# Patient Record
Sex: Female | Born: 1985 | Race: Black or African American | Hispanic: No | Marital: Single | Smoking: Never smoker
Health system: Southern US, Community
[De-identification: ages and names within clinical notes are randomized; demographics above are authoritative.]

## PROBLEM LIST (undated history)

## (undated) DIAGNOSIS — T7840XA Allergy, unspecified, initial encounter: Secondary | ICD-10-CM

## (undated) DIAGNOSIS — B019 Varicella without complication: Secondary | ICD-10-CM

## (undated) DIAGNOSIS — J45909 Unspecified asthma, uncomplicated: Secondary | ICD-10-CM

## (undated) HISTORY — DX: Unspecified asthma, uncomplicated: J45.909

## (undated) HISTORY — DX: Allergy, unspecified, initial encounter: T78.40XA

## (undated) HISTORY — DX: Varicella without complication: B01.9

---

## 2006-02-16 ENCOUNTER — Ambulatory Visit: Payer: Self-pay | Admitting: Obstetrics & Gynecology

## 2006-02-18 ENCOUNTER — Ambulatory Visit (HOSPITAL_COMMUNITY): Admission: RE | Admit: 2006-02-18 | Discharge: 2006-02-18 | Payer: Self-pay | Admitting: Obstetrics & Gynecology

## 2006-02-21 ENCOUNTER — Ambulatory Visit: Payer: Self-pay | Admitting: Family Medicine

## 2006-03-02 ENCOUNTER — Ambulatory Visit: Payer: Self-pay | Admitting: Obstetrics & Gynecology

## 2006-03-09 ENCOUNTER — Ambulatory Visit: Payer: Self-pay | Admitting: Obstetrics & Gynecology

## 2006-03-16 ENCOUNTER — Ambulatory Visit: Payer: Self-pay | Admitting: Family Medicine

## 2006-03-23 ENCOUNTER — Ambulatory Visit: Payer: Self-pay | Admitting: Gynecology

## 2006-03-30 ENCOUNTER — Inpatient Hospital Stay (HOSPITAL_COMMUNITY): Admission: AD | Admit: 2006-03-30 | Discharge: 2006-04-02 | Payer: Self-pay | Admitting: Obstetrics and Gynecology

## 2006-03-30 ENCOUNTER — Ambulatory Visit: Payer: Self-pay | Admitting: Family Medicine

## 2006-03-30 ENCOUNTER — Ambulatory Visit: Payer: Self-pay | Admitting: Obstetrics & Gynecology

## 2007-09-11 IMAGING — US US OB COMP +14 WK
1 series · 13 of 28 positions shown · non-contrast
Comparison: none

CLINICAL DATA: No prenatal care.  Anatomy.

[Series 1: us ob comp +14 wk · 0.33mm/px · 13 of 53 slices shown]
[im 2/53]
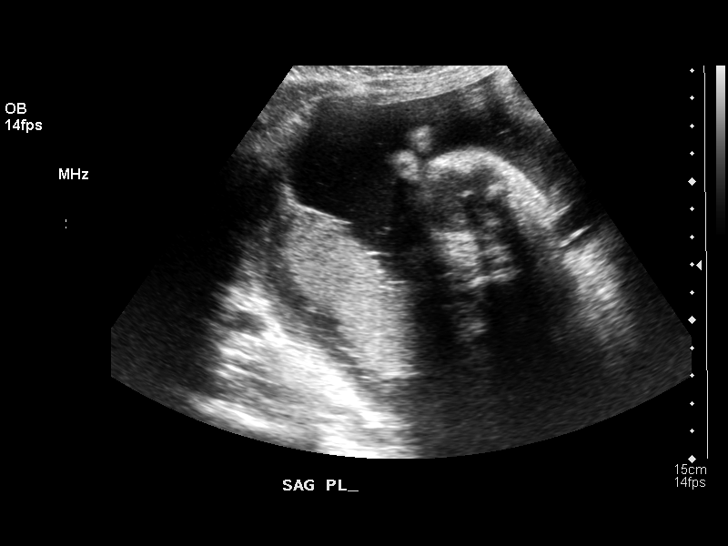
[im 6/53]
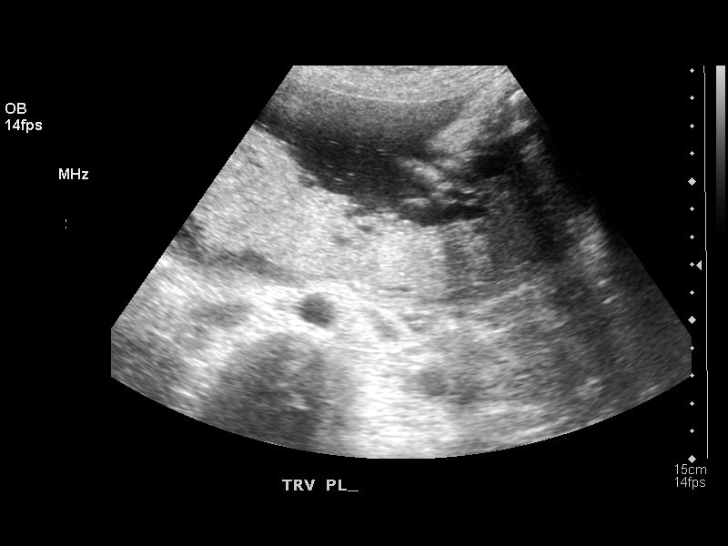
[im 10/53]
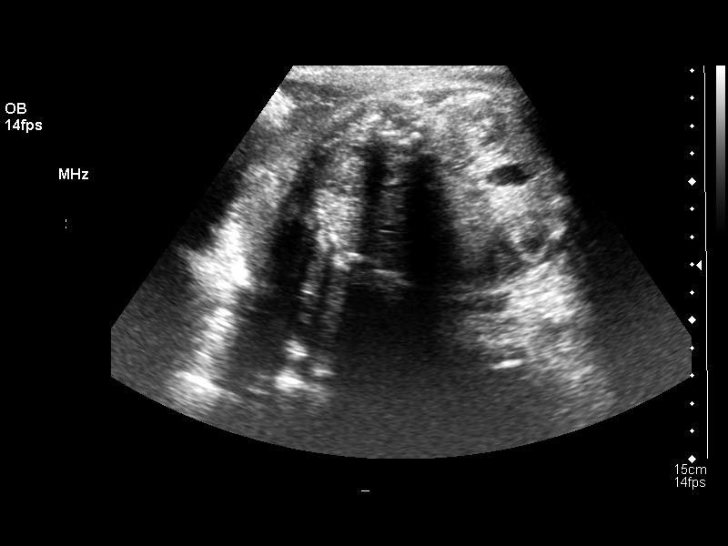
[im 14/53]
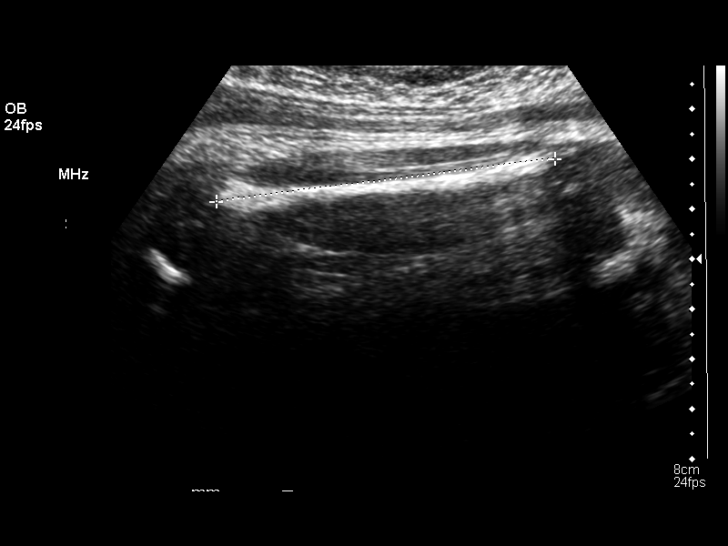
[im 18/53]
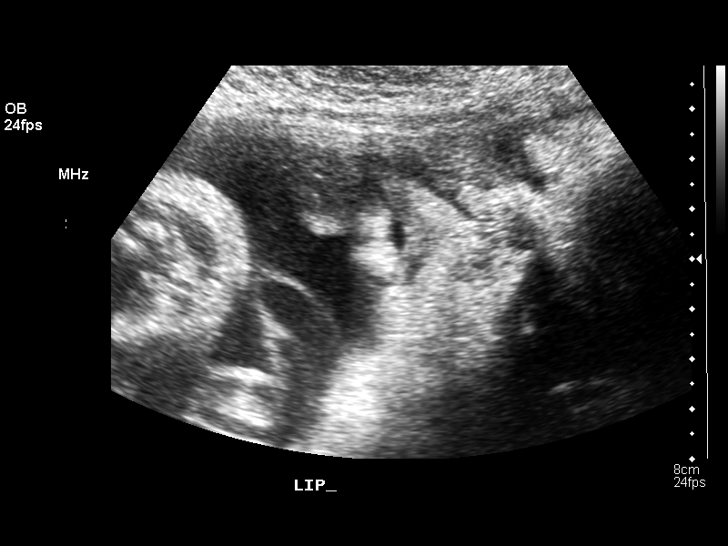
[im 22/53]
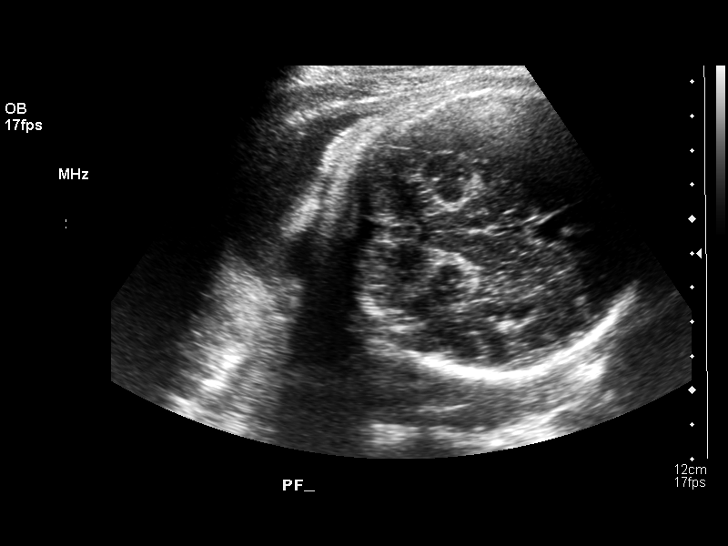
[im 27/53]
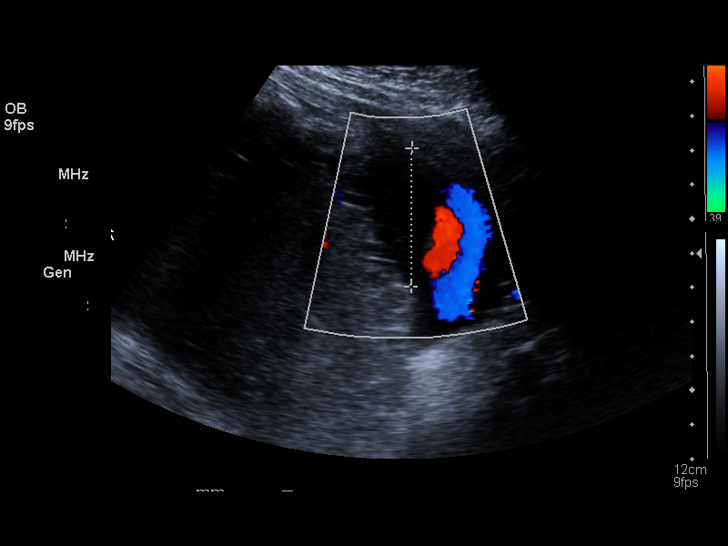
[im 31/53]
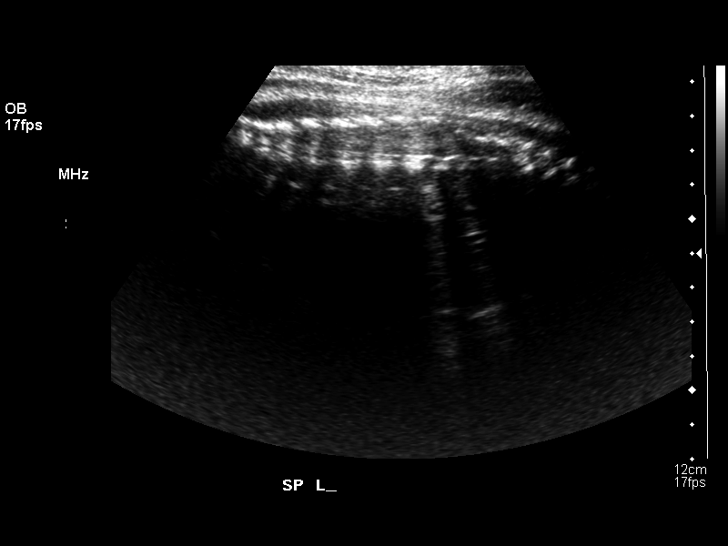
[im 35/53]
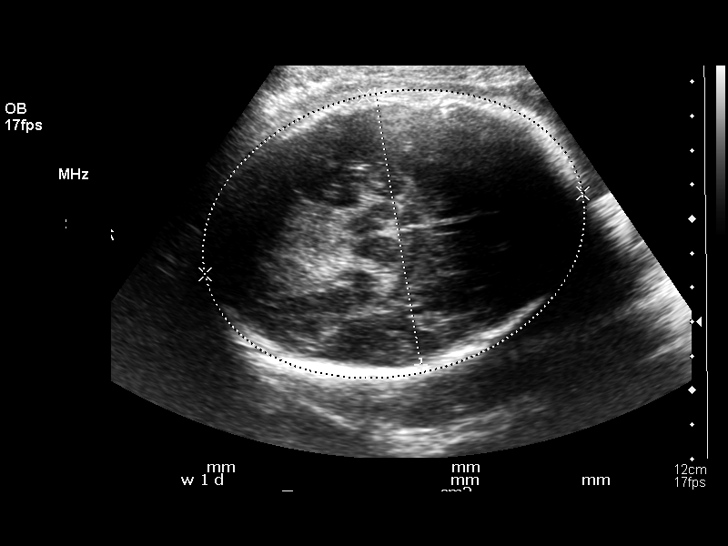
[im 39/53]
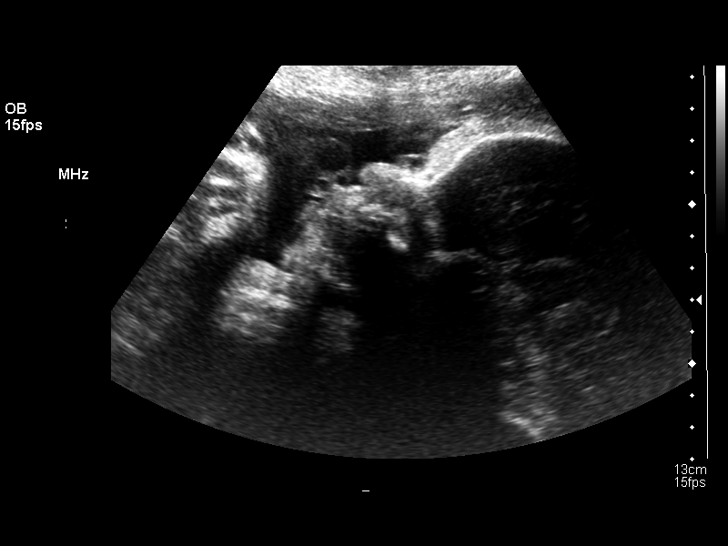
[im 43/53]
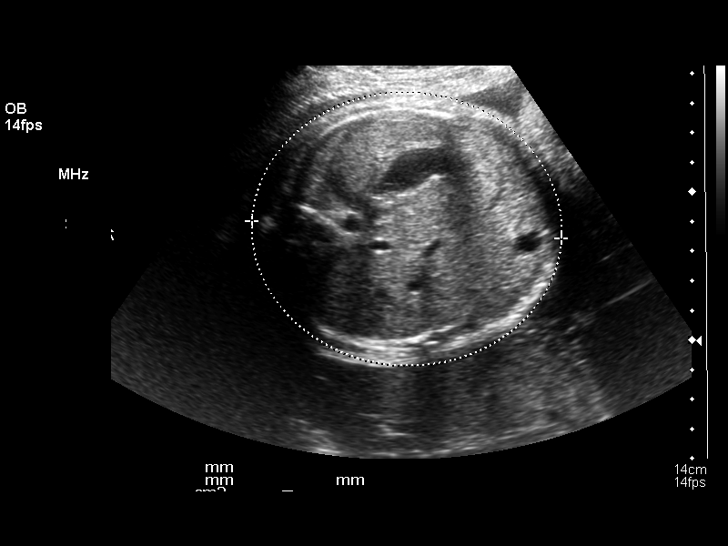
[im 47/53]
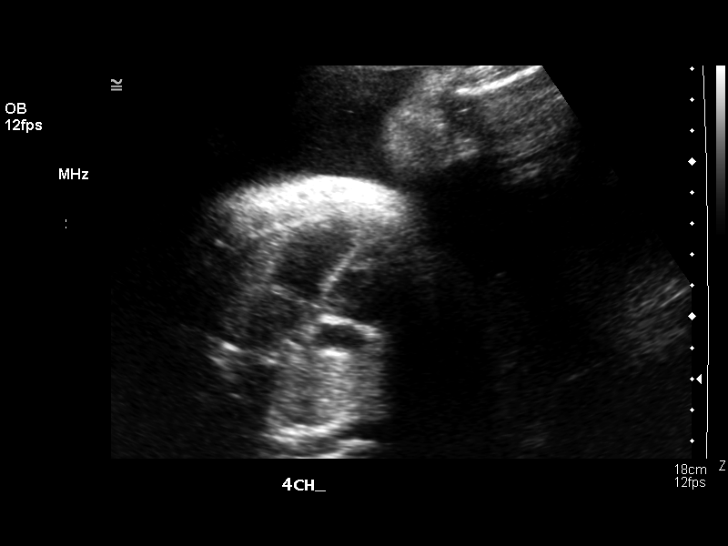
[im 51/53]
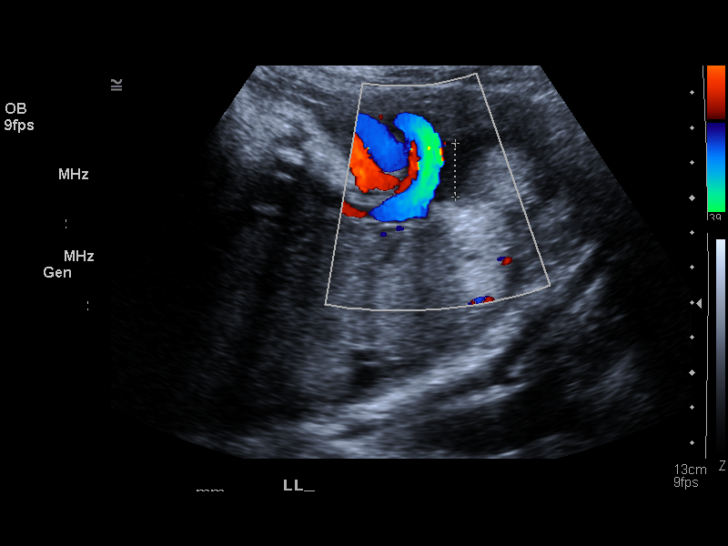

[13 of 28 positions shown; findings below may reference images not displayed]

OBSTETRICAL ULTRASOUND:
 Number of Fetuses: 1
 Heart Rate:  131
 Movement:  Yes
 Breathing:  No  
 Presentation:  Cephalic
 Placental Location:  Posterior
 Grade:  II
 Previa:  No
 Amniotic Fluid (Subjective):  Normal
 Amniotic Fluid (Objective):   14.6 cm AFI (5th -95th%ile = 7.9 ? 24.9 cm for 35 wks)

 FETAL BIOMETRY
 BPD:   8.2 cm   33 w 1 d
 HC:   30.9 cm   34 w 3 d
 AC:   31.3 cm   35 w 2 d
 FL:    6.9 cm   35 w 2 d

 MEAN GA:  34 w 4 d  US EDC:  03/28/06

 EFW:  1555 g (H) 50th ? 75th%ile (3888 ? 2453 g) For 35 wks

 FETAL ANATOMY
 Lateral Ventricles:    Visualized 
 Thalami/CSP:      Visualized 
 Posterior Fossa:  Not visualized 
 Nuchal Region:    N/A
 Spine:      Not visualized 
 4 Chamber Heart on Left:      Visualized 
 Stomach on Left:      Visualized 
 3 Vessel Cord:    Visualized 
 Cord Insertion site:    Not visualized 
 Kidneys:  Visualized 
 Bladder:  Visualized 
 Extremities:      Not visualized 

 ADDITIONAL ANATOMY VISUALIZED:  Upper lip, diaphragm, and female genitalia.

 Evaluation limited by:  Advanced gestational age 

 MATERNAL UTERINE AND ADNEXAL FINDINGS
 Cervix: Not evaluated > 34 wks
IMPRESSION: 1.  Single living intrauterine fetus in cephalic presentation with subjectively and quantitatively normal amniotic fluid volume.  The estimated mean gestational age by ultrasound today is 34 weeks 4 days which is within one week of the reported gestational age by LMP.  
 2.  Visualized fetal anatomy is unremarkable although the exam is substantially limited by the advanced gestational age.

## 2013-08-18 ENCOUNTER — Emergency Department (HOSPITAL_COMMUNITY)
Admission: EM | Admit: 2013-08-18 | Discharge: 2013-08-18 | Disposition: A | Discharge status: Home / Self Care | Payer: No Typology Code available for payment source | Attending: Emergency Medicine | Admitting: Emergency Medicine

## 2013-08-18 ENCOUNTER — Encounter (HOSPITAL_COMMUNITY): Payer: Self-pay

## 2013-08-18 DIAGNOSIS — Z79899 Other long term (current) drug therapy: Secondary | ICD-10-CM | POA: Insufficient documentation

## 2013-08-18 DIAGNOSIS — S0990XA Unspecified injury of head, initial encounter: Secondary | ICD-10-CM | POA: Insufficient documentation

## 2013-08-18 DIAGNOSIS — Y92410 Unspecified street and highway as the place of occurrence of the external cause: Secondary | ICD-10-CM | POA: Insufficient documentation

## 2013-08-18 DIAGNOSIS — S0993XA Unspecified injury of face, initial encounter: Secondary | ICD-10-CM | POA: Insufficient documentation

## 2013-08-18 DIAGNOSIS — Y9389 Activity, other specified: Secondary | ICD-10-CM | POA: Insufficient documentation

## 2013-08-18 MED ORDER — HYDROCODONE-ACETAMINOPHEN 5-325 MG PO TABS
1.0000 | ORAL_TABLET | Freq: Once | ORAL | Status: AC
  Administered 2013-08-18: 1 via ORAL
  Filled 2013-08-18: qty 1

## 2013-08-18 MED ORDER — CYCLOBENZAPRINE HCL 10 MG PO TABS: 10.0000 mg | ORAL_TABLET | Freq: Two times a day (BID) | ORAL | Status: DC | PRN

## 2013-08-18 MED ORDER — IBUPROFEN 800 MG PO TABS: 800.0000 mg | ORAL_TABLET | Freq: Three times a day (TID) | ORAL | Status: DC
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
## 2013-08-19 NOTE — ED Provider Notes (Signed)
Medical screening examination/treatment/procedure(s) were performed by non-physician practitioner and as supervising physician I was immediately available for consultation/collaboration.   Dagmar Hait, MD 08/19/13 [id number]

## 2013-08-21 ENCOUNTER — Encounter (HOSPITAL_COMMUNITY): Payer: Self-pay | Admitting: Emergency Medicine

## 2013-08-21 ENCOUNTER — Other Ambulatory Visit: Payer: Self-pay

## 2013-08-21 ENCOUNTER — Emergency Department (HOSPITAL_COMMUNITY)
Admission: EM | Admit: 2013-08-21 | Discharge: 2013-08-21 | Disposition: A | Discharge status: Home / Self Care | Payer: No Typology Code available for payment source | Attending: Emergency Medicine | Admitting: Emergency Medicine

## 2013-08-21 DIAGNOSIS — M542 Cervicalgia: Secondary | ICD-10-CM | POA: Insufficient documentation

## 2013-08-21 DIAGNOSIS — G8911 Acute pain due to trauma: Secondary | ICD-10-CM | POA: Insufficient documentation

## 2013-08-21 DIAGNOSIS — R209 Unspecified disturbances of skin sensation: Secondary | ICD-10-CM | POA: Insufficient documentation

## 2013-08-21 DIAGNOSIS — Z79899 Other long term (current) drug therapy: Secondary | ICD-10-CM | POA: Insufficient documentation

## 2013-08-21 DIAGNOSIS — R51 Headache: Secondary | ICD-10-CM | POA: Insufficient documentation

## 2013-08-21 DIAGNOSIS — R200 Anesthesia of skin: Secondary | ICD-10-CM
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
## 2013-08-21 NOTE — Progress Notes (Signed)
[facility] did not get to see patient but will be sending her information about the [facility], using the address provided.

## 2013-08-22 NOTE — ED Provider Notes (Signed)
Medical screening examination/treatment/procedure(s) were performed by non-physician practitioner and as supervising physician I was immediately available for consultation/collaboration.     Aundraya Dripps R Reginald Mangels, MD 08/22/13 2356 

## 2016-01-02 ENCOUNTER — Ambulatory Visit: Payer: Self-pay | Admitting: Certified Nurse Midwife

## 2016-01-14 ENCOUNTER — Ambulatory Visit: Payer: Self-pay | Admitting: Certified Nurse Midwife

## 2016-01-16 ENCOUNTER — Encounter: Payer: Self-pay | Admitting: Certified Nurse Midwife

## 2016-01-16 ENCOUNTER — Ambulatory Visit (INDEPENDENT_AMBULATORY_CARE_PROVIDER_SITE_OTHER): Payer: BLUE CROSS/BLUE SHIELD | Admitting: Certified Nurse Midwife

## 2016-01-16 VITALS — BP 127/73 | HR 72 | Temp 98.0°F | Wt 178.0 lb

## 2016-01-16 DIAGNOSIS — Z01419 Encounter for gynecological examination (general) (routine) without abnormal findings: Secondary | ICD-10-CM | POA: Diagnosis not present

## 2016-01-16 DIAGNOSIS — E669 Obesity, unspecified: Secondary | ICD-10-CM | POA: Insufficient documentation

## 2016-01-16 DIAGNOSIS — Z113 Encounter for screening for infections with a predominantly sexual mode of transmission: Secondary | ICD-10-CM

## 2016-01-16 DIAGNOSIS — Z809 Family history of malignant neoplasm, unspecified: Secondary | ICD-10-CM

## 2016-01-16 DIAGNOSIS — Z30018 Encounter for initial prescription of other contraceptives: Secondary | ICD-10-CM

## 2016-01-16 LAB — CBC WITH DIFFERENTIAL/PLATELET
Basophils Absolute: 0 10*3/uL (ref 0.0–0.1)
Basophils Relative: 0 % (ref 0–1)
Eosinophils Absolute: 0 10*3/uL (ref 0.0–0.7)
Eosinophils Relative: 1 % (ref 0–5)
HCT: 35.4 % — ABNORMAL LOW (ref 36.0–46.0)
Hemoglobin: 11.6 g/dL — ABNORMAL LOW (ref 12.0–15.0)
Lymphocytes Relative: 42 % (ref 12–46)
Lymphs Abs: 1.9 10*3/uL (ref 0.7–4.0)
MCH: 26.9 pg (ref 26.0–34.0)
MCHC: 32.8 g/dL (ref 30.0–36.0)
MCV: 81.9 fL (ref 78.0–100.0)
MPV: 9.7 fL (ref 8.6–12.4)
Monocytes Absolute: 0.5 10*3/uL (ref 0.1–1.0)
Monocytes Relative: 10 % (ref 3–12)
Neutro Abs: 2.2 10*3/uL (ref 1.7–7.7)
Neutrophils Relative %: 47 % (ref 43–77)
Platelets: 350 10*3/uL (ref 150–400)
RBC: 4.32 MIL/uL (ref 3.87–5.11)
RDW: 13.5 % (ref 11.5–15.5)
WBC: 4.6 10*3/uL (ref 4.0–10.5)

## 2016-01-16 LAB — COMPREHENSIVE METABOLIC PANEL
ALT: 8 U/L (ref 6–29)
AST: 11 U/L (ref 10–30)
Albumin: 3.8 g/dL (ref 3.6–5.1)
Alkaline Phosphatase: 62 U/L (ref 33–115)
BUN: 8 mg/dL (ref 7–25)
CO2: 22 mmol/L (ref 20–31)
Calcium: 9 mg/dL (ref 8.6–10.2)
Chloride: 108 mmol/L (ref 98–110)
Creat: 0.74 mg/dL (ref 0.50–1.10)
Glucose, Bld: 71 mg/dL (ref 65–99)
Potassium: 4.5 mmol/L (ref 3.5–5.3)
Sodium: 140 mmol/L (ref 135–146)
Total Bilirubin: 0.8 mg/dL (ref 0.2–1.2)
Total Protein: 6.5 g/dL (ref 6.1–8.1)

## 2016-01-16 LAB — HDL CHOLESTEROL: HDL: 52 mg/dL (ref >=46)

## 2016-01-16 LAB — TRIGLYCERIDES: Triglycerides: 64 mg/dL (ref <150)

## 2016-01-16 LAB — HEPATITIS C ANTIBODY: HCV Ab: NEGATIVE

## 2016-01-16 LAB — HIV ANTIBODY (ROUTINE TESTING W REFLEX): HIV 1&2 Ab, 4th Generation: NONREACTIVE

## 2016-01-16 LAB — HEPATITIS B SURFACE ANTIGEN: Hepatitis B Surface Ag: NEGATIVE

## 2016-01-16 LAB — CHOLESTEROL, TOTAL: Cholesterol: 133 mg/dL (ref 125–200)

## 2016-01-16 MED ORDER — ETONOGESTREL-ETHINYL ESTRADIOL 0.12-0.015 MG/24HR VA RING: VAGINAL_RING | VAGINAL | Status: DC

## 2016-01-16 NOTE — Addendum Note (Signed)
Addended by: Samantha Crimes on: 01/16/2016 01:17 PM   Modules accepted: Orders

## 2016-01-16 NOTE — Progress Notes (Signed)
Patient ID: Amy Miles, female   DOB: 1986-11-03, 30 y.o.   MRN: [id number]    Subjective:      Amy Miles is a 30 y.o. female here for a routine exam.  Current complaints: weight loss is stagnant, reports regular exercise.  Is sexually active, using condoms occasionally.  Has used OCPs, Mirena, Nuva Ring and condoms in the past.  Had excessive weight gain of over 20 lbs with Mirena.  Just stopped period yesterday.  Monthly menses, lasting 5 days, with dysmenorrhea, denies any clots.  Reports spotting with BM the week before her period, denies any rectal bleeding or hx of hemorrhoids.  Desires full STD screening.    Personal health questionnaire:  Is patient [name], have a family history of breast and/or ovarian cancer: yes, Maternal aunt Ovarian CA, MGM BCA: in her 16's.   Is there a family history of uterine cancer diagnosed at age < 30, gastrointestinal cancer, urinary tract cancer, family member who is a Amy Miles syndrome-associated carrier: yes, father deceased from pancreatic cancer.   Is the patient overweight and hypertensive, family history of diabetes, personal history of gestational diabetes, preeclampsia or PCOS: yes Is patient over 74, have PCOS,  family history of premature CHD under age 41, diabetes, smoke, have hypertension or peripheral artery disease:  yes At any time, has a partner hit, kicked or otherwise hurt or frightened you?: no Over the past 2 weeks, have you felt down, depressed or hopeless?: no Over the past 2 weeks, have you felt little interest or pleasure in doing things?:no   Gynecologic History Patient's last menstrual period was 01/11/2016. Contraception: condoms Last Pap: unknown. Results were: normal according to the patient Last mammogram: N/A.   Obstetric History OB History  No data available    History reviewed. No pertinent past medical history.  History reviewed. No pertinent past surgical history.   Current outpatient  prescriptions:  .  cyclobenzaprine (FLEXERIL) 10 MG tablet, Take 1 tablet (10 mg total) by mouth 2 (two) times daily as needed for muscle spasms. (Patient not taking: Reported on 01/16/2016), Disp: 20 tablet, Rfl: 0 .  etonogestrel-ethinyl estradiol (NUVARING) 0.12-0.015 MG/24HR vaginal ring, Insert vaginally and leave in place for 3 consecutive weeks, then remove for 1 week., Disp: 3 each, Rfl: 4 .  norgestimate-ethinyl estradiol (ORTHO-CYCLEN,SPRINTEC,PREVIFEM) 0.25-35 MG-MCG tablet, Take 1 tablet by mouth daily. Reported on 01/16/2016, Disp: , Rfl:  No Known Allergies  Social History  Substance Use Topics  . Smoking status: Never Smoker   . Smokeless tobacco: Not on file  . Alcohol Use: No    Family History  Problem Relation Age of Onset  . Cancer Father       Review of Systems  Constitutional: negative for fatigue and weight loss Respiratory: negative for cough and wheezing Cardiovascular: negative for chest pain, fatigue and palpitations Gastrointestinal: negative for abdominal pain and change in bowel habits Musculoskeletal:negative for myalgias Neurological: negative for gait problems and tremors Behavioral/Psych: negative for abusive relationship, depression Endocrine: negative for temperature intolerance   Genitourinary:negative for abnormal menstrual periods, genital lesions, hot flashes, sexual problems and vaginal discharge Integument/breast: negative for breast lump, breast tenderness, nipple discharge and skin lesion(s)    Objective:       BP 127/73 mmHg  Pulse 72  Temp(Src) 98 F (36.7 C)  Wt 178 lb (80.74 kg)  LMP 01/11/2016 General:   alert  Skin:   no rash or abnormalities  Lungs:   clear to auscultation bilaterally  Heart:   regular rate and rhythm, S1, S2 normal, no murmur, click, rub or gallop  Breasts:   normal without suspicious masses, skin or nipple changes or axillary nodes  Abdomen:  normal findings: no organomegaly, soft, non-tender and no  hernia obese  Pelvis:  External genitalia: normal general appearance Urinary system: urethral meatus normal and bladder without fullness, nontender Vaginal: normal without tenderness, induration or masses Cervix: normal appearance Adnexa: normal bimanual exam Uterus: anteverted and non-tender, normal size   Lab Review Urine pregnancy test Labs reviewed yes Radiologic studies reviewed no  50% of 30 min visit spent on counseling and coordination of care.   Assessment:    Healthy female exam.   Contraception management: initial rx for Nuva Ring  STD screening exam  Obesity  Family hx of cancers Plan:    Education reviewed: calcium supplements, depression evaluation, low fat, low cholesterol diet, safe sex/STD prevention, self breast exams, skin cancer screening and weight bearing exercise. Contraception: NuvaRing vaginal inserts. Follow up in: 1 year.   Prenatal vitamin samples given  Meds ordered this encounter  Medications  . etonogestrel-ethinyl estradiol (NUVARING) 0.12-0.015 MG/24HR vaginal ring    Sig: Insert vaginally and leave in place for 3 consecutive weeks, then remove for 1 week.    Dispense:  3 each    Refill:  4   Orders Placed This Encounter  Procedures  . SureSwab, Vaginosis/Vaginitis Plus  . HIV antibody (with reflex)  . Hepatitis B surface antigen  . RPR  . Hepatitis C antibody  . CBC with Differential/Platelet  . Comprehensive metabolic panel  . TSH  . Cholesterol, total  . Triglycerides  . HDL cholesterol  . Ambulatory referral to Genetics    Referral Priority:  Routine    Referral Type:  Consultation    Referral Reason:  Specialty Services Required    Number of Visits Requested:  1  . Ambulatory referral to Internal Medicine    Referral Priority:  Routine    Referral Type:  Consultation    Referral Reason:  Specialty Services Required    Requested Specialty:  Internal Medicine    Number of Visits Requested:  1   Need to obtain previous  records Possible management options include: OCPs Follow up as needed.

## 2016-01-17 LAB — TSH: TSH: 1.04 mIU/L

## 2016-01-17 LAB — RPR

## 2016-01-19 ENCOUNTER — Telehealth: Payer: Self-pay | Admitting: Genetic Counselor

## 2016-01-19 NOTE — Telephone Encounter (Signed)
Left message on pt's voicemail in regards to a reference from [facility] to schedule appt with Gen. Coun. Left callback number

## 2016-01-21 LAB — PAP, TP IMAGING W/ HPV RNA, RFLX HPV TYPE 16,18/45: HPV mRNA, High Risk: NOT DETECTED

## 2016-01-22 LAB — SURESWAB, VAGINOSIS/VAGINITIS PLUS
Atopobium vaginae: NOT DETECTED Log (cells/mL)
C. albicans, DNA: NOT DETECTED
C. glabrata, DNA: NOT DETECTED
C. parapsilosis, DNA: NOT DETECTED
C. trachomatis RNA, TMA: NOT DETECTED
C. tropicalis, DNA: NOT DETECTED
Gardnerella vaginalis: NOT DETECTED Log (cells/mL)
LACTOBACILLUS SPECIES: NOT DETECTED Log (cells/mL)
MEGASPHAERA SPECIES: NOT DETECTED Log (cells/mL)
N. gonorrhoeae RNA, TMA: NOT DETECTED
T. vaginalis RNA, QL TMA: NOT DETECTED

## 2016-02-26 ENCOUNTER — Ambulatory Visit (INDEPENDENT_AMBULATORY_CARE_PROVIDER_SITE_OTHER): Payer: BLUE CROSS/BLUE SHIELD | Admitting: Family

## 2016-02-26 ENCOUNTER — Encounter: Payer: Self-pay | Admitting: Family

## 2016-02-26 VITALS — BP 120/80 | HR 76 | Temp 98.0°F | Resp 18 | Ht 60.0 in | Wt 176.0 lb

## 2016-02-26 DIAGNOSIS — E669 Obesity, unspecified: Secondary | ICD-10-CM

## 2016-02-26 NOTE — Assessment & Plan Note (Signed)
BMI of 34. She continues to work on lifestyle management with a goal of 5-10% of current body weight loss. Recommend increasing physical activity to 30 minutes of moderate level activity daily. Encourage nutritional intake that focuses on nutrient dense foods and is moderate, varied, and balanced and is low in saturated fats and processed/sugary foods. Discussed importance of continuing to challenge her exercise through time, resistance, or modality. Follow-up in one month with food and exercise log to check progress.

## 2016-02-26 NOTE — Progress Notes (Signed)
Subjective:    Patient ID: Amy Miles, female    DOB: [birth year], 30 y.o.   MRN: [id number]  Chief Complaint  Patient presents with  . Establish Care    wants to talk about weight, get some pointers on what to do and the best things to eat    HPI:  Amy Miles is a 30 y.o. female who  has a past medical history of Chicken pox. and presents today for an office visit to establish care.   1.)  Weight Issues / Concerns: Associated symptom of increased weight in the context of obesity has been going on for several years. Modifying factors include physical activity about 3 times of cardiovascular and resistance training as well as nutrition modifcations including fruits and vegetables as well as trying to reduce fast food and processed/sugary foods. In the past she has worked on portion control. She has also started with a Fitbit during the last year. Describes like she is at a sticking point.    Wt Readings from Last 3 Encounters:  02/26/16 176 lb (79.833 kg)  01/16/16 178 lb (80.74 kg)    No Known Allergies   Outpatient Prescriptions Prior to Visit  Medication Sig Dispense Refill  . etonogestrel-ethinyl estradiol (NUVARING) 0.12-0.015 MG/24HR vaginal ring Insert vaginally and leave in place for 3 consecutive weeks, then remove for 1 week. 3 each 4  . cyclobenzaprine (FLEXERIL) 10 MG tablet Take 1 tablet (10 mg total) by mouth 2 (two) times daily as needed for muscle spasms. (Patient not taking: Reported on 01/16/2016) 20 tablet 0  . norgestimate-ethinyl estradiol (ORTHO-CYCLEN,SPRINTEC,PREVIFEM) 0.25-35 MG-MCG tablet Take 1 tablet by mouth daily. Reported on 01/16/2016     No facility-administered medications prior to visit.     Past Medical History  Diagnosis Date  . Chicken pox     History reviewed. No pertinent past surgical history.   Family History  Problem Relation Age of Onset  . Pancreatic cancer Father   . Healthy Mother   . Breast cancer Maternal  Grandmother   . Diabetes Maternal Grandmother   . Stroke Maternal Grandmother   . Heart disease Paternal Grandmother   . Lung cancer Paternal Grandfather   . Throat cancer Paternal Grandfather      Social History   Social History  . Marital Status: Single    Spouse Name: N/A  . Number of Children: 1  . Years of Education: 14   Occupational History  . Daycare Teacher    Social History Main Topics  . Smoking status: Never Smoker   . Smokeless tobacco: Never Used  . Alcohol Use: 0.0 oz/week    0 Standard drinks or equivalent per week     Comment: Social / Office manager  . Drug Use: No  . Sexual Activity:    Partners: Male    Copy: None, Inserts   Other Topics Concern  . Not on file   Social History Narrative   Fun: Play outside with her daughter, watch TV   Denies abuse and feels safe at home.     Review of Systems  Constitutional: Negative for fever and chills.  Respiratory: Negative for chest tightness and shortness of breath.   Cardiovascular: Negative for chest pain, palpitations and leg swelling.  Neurological: Negative for dizziness and light-headedness.      Objective:    BP 120/80 mmHg  Pulse 76  Temp(Src) 98 F (36.7 C) (Oral)  Resp 18  Ht 5' (1.524 m)  Wt 176 lb (79.833 kg)  BMI 34.37 kg/m2  SpO2 99% Nursing note and vital signs reviewed.  Physical Exam  Constitutional: She is oriented to person, place, and time. She appears well-developed and well-nourished. No distress.  Cardiovascular: Normal rate, regular rhythm, normal heart sounds and intact distal pulses.   Pulmonary/Chest: Effort normal and breath sounds normal.  Neurological: She is alert and oriented to person, place, and time.  Skin: Skin is warm and dry.  Psychiatric: She has a normal mood and affect. Her behavior is normal. Judgment and thought content normal.       Assessment & Plan:   Problem List Items Addressed This Visit      Other   Obesity -  Primary    BMI of 34. She continues to work on lifestyle management with a goal of 5-10% of current body weight loss. Recommend increasing physical activity to 30 minutes of moderate level activity daily. Encourage nutritional intake that focuses on nutrient dense foods and is moderate, varied, and balanced and is low in saturated fats and processed/sugary foods. Discussed importance of continuing to challenge her exercise through time, resistance, or modality. Follow-up in one month with food and exercise log to check progress.          I have discontinued Ms. Corredor's norgestimate-ethinyl estradiol and cyclobenzaprine. I am also having her maintain her etonogestrel-ethinyl estradiol.   Follow-up: Return in about 1 month (around 03/27/2016).  Amy Miles, Amy Ekstrand, FNP

## 2016-02-26 NOTE — Patient Instructions (Addendum)
Thank you for choosing [facility].  Summary/Instructions:  Please track your calories and exercise.  Increase one of the following in you exercise - TIME, RESISTANCE, OR MODALITY (differing machines)  Aim for 30 minutes of moderate level activity daily.  Nutrient dense foods like fruits and vegetables with minimal processed/sugary foods.  Exercising to Lose Weight Exercising can help you to lose weight. In order to lose weight through exercise, you need to do vigorous-intensity exercise. You can tell that you are exercising with vigorous intensity if you are breathing very hard and fast and cannot hold a conversation while exercising. Moderate-intensity exercise helps to maintain your current weight. You can tell that you are exercising at a moderate level if you have a higher heart rate and faster breathing, but you are still able to hold a conversation. HOW OFTEN SHOULD I EXERCISE? Choose an activity that you enjoy and set realistic goals. Your health care provider can help you to make an activity plan that works for you. Exercise regularly as directed by your health care provider. This may include:  Doing resistance training twice each week, such as:  Push-ups.  Sit-ups.  Lifting weights.  Using resistance bands.  Doing a given intensity of exercise for a given amount of time. Choose from these options:  150 minutes of moderate-intensity exercise every week.  75 minutes of vigorous-intensity exercise every week.  A mix of moderate-intensity and vigorous-intensity exercise every week. Children, pregnant women, people who are out of shape, people who are overweight, and older adults may need to consult a health care provider for individual recommendations. If you have any sort of medical condition, be sure to consult your health care provider before starting a new exercise program. WHAT ARE SOME ACTIVITIES THAT CAN HELP ME TO LOSE WEIGHT?   Walking at a rate of at least  4.5 miles an hour.  Jogging or running at a rate of 5 miles per hour.  Biking at a rate of at least 10 miles per hour.  Lap swimming.  Roller-skating or in-line skating.  Cross-country skiing.  Vigorous competitive sports, such as football, basketball, and soccer.  Jumping rope.  Aerobic dancing. HOW CAN I BE MORE ACTIVE IN MY DAY-TO-DAY ACTIVITIES?  Use the stairs instead of the elevator.  Take a walk during your lunch break.  If you drive, park your car farther away from work or school.  If you take public transportation, get off one stop early and walk the rest of the way.  Make all of your phone calls while standing up and walking around.  Get up, stretch, and walk around every 30 minutes throughout the day. WHAT GUIDELINES SHOULD I FOLLOW WHILE EXERCISING?  Do not exercise so much that you hurt yourself, feel dizzy, or get very short of breath.  Consult your health care provider prior to starting a new exercise program.  Wear comfortable clothes and shoes with good support.  Drink plenty of water while you exercise to prevent dehydration or heat stroke. Body water is lost during exercise and must be replaced.  Work out until you breathe faster and your heart beats faster.   This information is not intended to replace advice given to you by your health care provider. Make sure you discuss any questions you have with your health care provider.   Document Released: 12/11/2010 Document Revised: 11/29/2014 Document Reviewed: 04/11/2014 Elsevier Interactive Patient Education 2016 [employer]  Calorie Counting for [employer] Calories are energy you get from the  things you eat and drink. Your body uses this energy to keep you going throughout the day. The number of calories you eat affects your weight. When you eat more calories than your body needs, your body stores the extra calories as fat. When you eat fewer calories than your body needs, your body burns fat to  get the energy it needs. Calorie counting means keeping track of how many calories you eat and drink each day. If you make sure to eat fewer calories than your body needs, you should lose weight. In order for calorie counting to work, you will need to eat the number of calories that are right for you in a day to lose a healthy amount of weight per week. A healthy amount of weight to lose per week is usually 1-2 lb (0.5-0.9 kg). A dietitian can determine how many calories you need in a day and give you suggestions on how to reach your calorie goal.  WHAT IS MY MY PLAN? My goal is to have __________ calories per day.  If I have this many calories per day, I should lose around __________ pounds per week. WHAT DO I NEED TO KNOW ABOUT CALORIE COUNTING? In order to meet your daily calorie goal, you will need to:  Find out how many calories are in each food you would like to eat. Try to do this before you eat.  Decide how much of the food you can eat.  Write down what you ate and how many calories it had. Doing this is called keeping a food log. WHERE DO I FIND CALORIE INFORMATION? The number of calories in a food can be found on a Nutrition Facts label. Note that all the information on a label is based on a specific serving of the food. If a food does not have a Nutrition Facts label, try to look up the calories online or ask your dietitian for help. HOW DO I DECIDE HOW MUCH TO EAT? To decide how much of the food you can eat, you will need to consider both the number of calories in one serving and the size of one serving. This information can be found on the Nutrition Facts label. If a food does not have a Nutrition Facts label, look up the information online or ask your dietitian for help. Remember that calories are listed per serving. If you choose to have more than one serving of a food, you will have to multiply the calories per serving by the amount of servings you plan to eat. For example, the label  on a package of bread might say that a serving size is 1 slice and that there are 90 calories in a serving. If you eat 1 slice, you will have eaten 90 calories. If you eat 2 slices, you will have eaten 180 calories. HOW DO I KEEP A FOOD LOG? After each meal, record the following information in your food log:  What you ate.  How much of it you ate.  How many calories it had.  Then, add up your calories. Keep your food log near you, such as in a small notebook in your pocket. Another option is to use a mobile app or website. Some programs will calculate calories for you and show you how many calories you have left each time you add an item to the log. WHAT ARE SOME CALORIE COUNTING TIPS?  Use your calories on foods and drinks that will fill you up and not leave you  hungry. Some examples of this include foods like nuts and nut butters, vegetables, lean proteins, and high-fiber foods (more than 5 g fiber per serving).  Eat nutritious foods and avoid empty calories. Empty calories are calories you get from foods or beverages that do not have many nutrients, such as candy and soda. It is better to have a nutritious high-calorie food (such as an avocado) than a food with few nutrients (such as a bag of chips).  Know how many calories are in the foods you eat most often. This way, you do not have to look up how many calories they have each time you eat them.  Look out for foods that may seem like low-calorie foods but are really high-calorie foods, such as baked goods, soda, and fat-free candy.  Pay attention to calories in drinks. Drinks such as sodas, specialty coffee drinks, alcohol, and juices have a lot of calories yet do not fill you up. Choose low-calorie drinks like water and diet drinks.  Focus your calorie counting efforts on higher calorie items. Logging the calories in a garden salad that contains only vegetables is less important than calculating the calories in a milk shake.  Find a  way of tracking calories that works for you. Get creative. Most people who are successful find ways to keep track of how much they eat in a day, even if they do not count every calorie. WHAT ARE SOME PORTION CONTROL TIPS?  Know how many calories are in a serving. This will help you know how many servings of a certain food you can have.  Use a measuring cup to measure serving sizes. This is helpful when you start out. With time, you will be able to estimate serving sizes for some foods.  Take some time to put servings of different foods on your favorite plates, bowls, and cups so you know what a serving looks like.  Try not to eat straight from a bag or box. Doing this can lead to overeating. Put the amount you would like to eat in a cup or on a plate to make sure you are eating the right portion.  Use smaller plates, glasses, and bowls to prevent overeating. This is a quick and easy way to practice portion control. If your plate is smaller, less food can fit on it.  Try not to multitask while eating, such as watching TV or using your computer. If it is time to eat, sit down at a table and enjoy your food. Doing this will help you to start recognizing when you are full. It will also make you more aware of what and how much you are eating. HOW CAN I CALORIE COUNT WHEN EATING OUT?  Ask for smaller portion sizes or child-sized portions.  Consider sharing an entree and sides instead of getting your own entree.  If you get your own entree, eat only half. Ask for a box at the beginning of your meal and put the rest of your entree in it so you are not tempted to eat it.  Look for the calories on the menu. If calories are listed, choose the lower calorie options.  Choose dishes that include vegetables, fruits, whole grains, low-fat dairy products, and lean protein. Focusing on smart food choices from each of the 5 food groups can help you stay on track at restaurants.  Choose items that are boiled,  broiled, grilled, or steamed.  Choose water, milk, unsweetened iced tea, or other drinks without added sugars.  If you want an alcoholic beverage, choose a lower calorie option. For example, a regular margarita can have up to 700 calories and a glass of wine has around 150.  Stay away from items that are buttered, battered, fried, or served with cream sauce. Items labeled "crispy" are usually fried, unless stated otherwise.  Ask for dressings, sauces, and syrups on the side. These are usually very high in calories, so do not eat much of them.  Watch out for salads. Many people think salads are a healthy option, but this is often not the case. Many salads come with bacon, fried chicken, lots of cheese, fried chips, and dressing. All of these items have a lot of calories. If you want a salad, choose a garden salad and ask for grilled meats or steak. Ask for the dressing on the side, or ask for olive oil and vinegar or lemon to use as dressing.  Estimate how many servings of a food you are given. For example, a serving of cooked rice is  cup or about the size of half a tennis ball or one cupcake wrapper. Knowing serving sizes will help you be aware of how much food you are eating at restaurants. The list below tells you how big or small some common portion sizes are based on everyday objects.  1 oz--4 stacked dice.  3 oz--1 deck of cards.  1 tsp--1 dice.  1 Tbsp-- a Ping-Pong ball.  2 Tbsp--1 Ping-Pong ball.   cup--1 tennis ball or 1 cupcake wrapper.  1 cup--1 baseball.   This information is not intended to replace advice given to you by your health care provider. Make sure you discuss any questions you have with your health care provider.   Document Released: 11/08/2005 Document Revised: 11/29/2014 Document Reviewed: 09/13/2013 Elsevier Interactive Patient Education [employer]

## 2016-02-26 NOTE — Progress Notes (Signed)
Pre visit review using our clinic review tool, if applicable. No additional management support is needed unless otherwise documented below in the visit note. 

## 2016-03-31 ENCOUNTER — Ambulatory Visit: Payer: BLUE CROSS/BLUE SHIELD | Admitting: Family

## 2016-04-15 ENCOUNTER — Ambulatory Visit: Payer: BLUE CROSS/BLUE SHIELD | Admitting: Family

## 2017-02-14 ENCOUNTER — Ambulatory Visit (INDEPENDENT_AMBULATORY_CARE_PROVIDER_SITE_OTHER): Payer: BLUE CROSS/BLUE SHIELD | Admitting: Family

## 2017-02-14 ENCOUNTER — Encounter: Payer: Self-pay | Admitting: Family

## 2017-02-14 VITALS — BP 116/76 | HR 65 | Temp 98.3°F | Resp 16 | Ht 60.0 in | Wt 175.8 lb

## 2017-02-14 DIAGNOSIS — Z23 Encounter for immunization: Secondary | ICD-10-CM

## 2017-02-14 DIAGNOSIS — Z0000 Encounter for general adult medical examination without abnormal findings: Secondary | ICD-10-CM | POA: Diagnosis not present

## 2017-02-14 NOTE — Assessment & Plan Note (Signed)
1) Anticipatory Guidance: Discussed importance of wearing a seatbelt while driving and not texting while driving; changing batteries in smoke detector at least once annually; wearing suntan lotion when outside; eating a balanced and moderate diet; getting physical activity at least 30 minutes per day.  2) Immunizations / Screenings / Labs:  Tetanus updated today. All other immunizations are up-to-date per recommendations. Cervical cancer screening up-to-date. Obtain vitamin D for vitamin D deficiency screening. Due for a dental exam encouraged to be completed independently. All other screenings are up-to-date per recommendations. Obtain CBC, CMET, and lipid profile.    Overall well exam with risk factors for cardiovascular disease including obesity. Recommend weight loss of 5-10% of current body weight through nutrition and physical activity changes. Encouraged to consume a moderate, balance, and varied nutritional intake. Continue physical activity increasing with goal to 3-4 days per week. She does have aspiration a becoming a paramedic and taking the physical agility test shortly. Continue other healthy lifestyle behaviors and choices. Follow-up prevention exam in 1 year. Follow-up office visit pending blood work.

## 2017-02-14 NOTE — Progress Notes (Signed)
Subjective:    Patient ID: Amy Miles, female    DOB: 07/13/1986, 31 y.o.   MRN: [id number]  Chief Complaint  Patient presents with  . CPE    have not ate since 8:30    HPI:  Amy Miles is a 31 y.o. female who presents today for an annual wellness visit.   1) Health Maintenance -   Diet - Averages about 3 meals per day with some snacks and consisting of a regular diet; Caffeine intake every now and then   Exercise - 2-3x per week consisting of cardio and some resistance training.    2) Preventative Exams / Immunizations:  Dental -- Due for exam  Vision -- Up to date   Health Maintenance  Topic Date Due  . TETANUS/TDAP  05/27/2003  . INFLUENZA VACCINE  02/19/2017 (Originally 06/22/2016)  . PAP SMEAR  01/15/2019  . HIV Screening  Completed     There is no immunization history on file for this patient.   No Known Allergies   Outpatient Medications Prior to Visit  Medication Sig Dispense Refill  . etonogestrel-ethinyl estradiol (NUVARING) 0.12-0.015 MG/24HR vaginal ring Insert vaginally and leave in place for 3 consecutive weeks, then remove for 1 week. 3 each 4   No facility-administered medications prior to visit.      Past Medical History:  Diagnosis Date  . Chicken pox      History reviewed. No pertinent surgical history.   Family History  Problem Relation Age of Onset  . Pancreatic cancer Father   . Healthy Mother   . Breast cancer Maternal Grandmother   . Diabetes Maternal Grandmother   . Stroke Maternal Grandmother   . Heart disease Paternal Grandmother   . Lung cancer Paternal Grandfather   . Throat cancer Paternal Grandfather      Social History   Social History  . Marital status: Single    Spouse name: N/A  . Number of children: 1  . Years of education: [id number]   Occupational History  . Daycare Teacher    Social History Main Topics  . Smoking status: Never Smoker  . Smokeless tobacco: Never Used  . Alcohol use 0.0  oz/week     Comment: Social / Office managerccassional  . Drug use: No  . Sexual activity: Yes    Partners: Male    Birth control/ protection: None, Inserts   Other Topics Concern  . Not on file   Social History Narrative   Fun: Play outside with her daughter, watch TV   Denies abuse and feels safe at home.       Review of Systems  Constitutional: Denies fever, chills, fatigue, or significant weight gain/loss. HENT: Head: Denies headache or neck pain Ears: Denies changes in hearing, ringing in ears, earache, drainage Nose: Denies discharge, stuffiness, itching, nosebleed, sinus pain Throat: Denies sore throat, hoarseness, dry mouth, sores, thrush Eyes: Denies loss/changes in vision, pain, redness, blurry/double vision, flashing lights Cardiovascular: Denies chest pain/discomfort, tightness, palpitations, shortness of breath with activity, difficulty lying down, swelling, sudden awakening with shortness of breath Respiratory: Denies shortness of breath, cough, sputum production, wheezing Gastrointestinal: Denies dysphasia, heartburn, change in appetite, nausea, change in bowel habits, rectal bleeding, constipation, diarrhea, yellow skin or eyes Genitourinary: Denies frequency, urgency, burning/pain, blood in urine, incontinence, change in urinary strength. Musculoskeletal: Denies muscle/joint pain, stiffness, back pain, redness or swelling of joints, trauma Skin: Denies rashes, lumps, itching, dryness, color changes, or hair/nail changes Neurological: Denies dizziness, fainting, seizures,  weakness, numbness, tingling, tremor Psychiatric - Denies nervousness, stress, depression or memory loss Endocrine: Denies heat or cold intolerance, sweating, frequent urination, excessive thirst, changes in appetite Hematologic: Denies ease of bruising or bleeding     Objective:     BP 116/76 (BP Location: Left Arm, Patient Position: Sitting, Cuff Size: Large)   Pulse 65   Temp 98.3 F (36.8 C) (Oral)    Resp 16   Ht 5' (1.524 m)   Wt 175 lb 12.8 oz (79.7 kg)   SpO2 98%   BMI 34.33 kg/m  Nursing note and vital signs reviewed.  Physical Exam  Constitutional: She is oriented to person, place, and time. She appears well-developed and well-nourished.  HENT:  Head: Normocephalic.  Right Ear: Hearing, tympanic membrane, external ear and ear canal normal.  Left Ear: Hearing, tympanic membrane, external ear and ear canal normal.  Nose: Nose normal.  Mouth/Throat: Uvula is midline, oropharynx is clear and moist and mucous membranes are normal.  Eyes: Conjunctivae and EOM are normal. Pupils are equal, round, and reactive to light.  Neck: Neck supple. No JVD present. No tracheal deviation present. No thyromegaly present.  Cardiovascular: Normal rate, regular rhythm, normal heart sounds and intact distal pulses.   Pulmonary/Chest: Effort normal and breath sounds normal.  Abdominal: Soft. Bowel sounds are normal. She exhibits no distension and no mass. There is no tenderness. There is no rebound and no guarding.  Musculoskeletal: Normal range of motion. She exhibits no edema or tenderness.  Lymphadenopathy:    She has no cervical adenopathy.  Neurological: She is alert and oriented to person, place, and time. She has normal reflexes. No cranial nerve deficit. She exhibits normal muscle tone. Coordination normal.  Skin: Skin is warm and dry.  Psychiatric: She has a normal mood and affect. Her behavior is normal. Judgment and thought content normal.       Assessment & Plan:   Problem List Items Addressed This Visit      Other   Routine adult health maintenance    1) Anticipatory Guidance: Discussed importance of wearing a seatbelt while driving and not texting while driving; changing batteries in smoke detector at least once annually; wearing suntan lotion when outside; eating a balanced and moderate diet; getting physical activity at least 30 minutes per day.  2) Immunizations / Screenings  / Labs:  Tetanus updated today. All other immunizations are up-to-date per recommendations. Cervical cancer screening up-to-date. Obtain vitamin D for vitamin D deficiency screening. Due for a dental exam encouraged to be completed independently. All other screenings are up-to-date per recommendations. Obtain CBC, CMET, and lipid profile.    Overall well exam with risk factors for cardiovascular disease including obesity. Recommend weight loss of 5-10% of current body weight through nutrition and physical activity changes. Encouraged to consume a moderate, balance, and varied nutritional intake. Continue physical activity increasing with goal to 3-4 days per week. She does have aspiration a becoming a paramedic and taking the physical agility test shortly. Continue other healthy lifestyle behaviors and choices. Follow-up prevention exam in 1 year. Follow-up office visit pending blood work.       Relevant Orders   CBC   Comprehensive metabolic panel   Lipid panel   TSH   VITAMIN D 25 Hydroxy (Vit-D Deficiency, Fractures)       I am having Ms. Simoneaux maintain her etonogestrel-ethinyl estradiol.   Follow-up: Return in about 1 year (around 02/14/2018), or if symptoms worsen or fail to improve.  Mauricio Po, FNP

## 2017-02-14 NOTE — Patient Instructions (Addendum)
Thank you for choosing [facility].  SUMMARY AND INSTRUCTIONS:  Labs:  Please stop by the lab on the lower level of the building for your blood work. Your results will be released to MyChart (or called to you) after review, usually within 72 hours after test completion. If any changes need to be made, you will be notified at that same time.  1.) The lab is open from 7:30am to 5:30 pm Monday-Friday 2.) No appointment is necessary 3.) Fasting (if needed) is 6-8 hours after food and drink; black coffee and water are okay   Follow up:  If your symptoms worsen or fail to improve, please contact our office for further instruction, or in case of emergency go directly to the emergency room at the closest medical facility.    Health Maintenance, Female Adopting a healthy lifestyle and getting preventive care can go a long way to promote health and wellness. Talk with your health care provider about what schedule of regular examinations is right for you. This is a good chance for you to check in with your provider about disease prevention and staying healthy. In between checkups, there are plenty of things you can do on your own. Experts have done a lot of research about which lifestyle changes and preventive measures are most likely to keep you healthy. Ask your health care provider for more information. Weight and diet Eat a healthy diet  Be sure to include plenty of vegetables, fruits, low-fat dairy products, and lean protein.  Do not eat a lot of foods high in solid fats, added sugars, or salt.  Get regular exercise. This is one of the most important things you can do for your health.  Most adults should exercise for at least 150 minutes each week. The exercise should increase your heart rate and make you sweat (moderate-intensity exercise).  Most adults should also do strengthening exercises at least twice a week. This is in addition to the moderate-intensity exercise. Maintain a  healthy weight  Body mass index (BMI) is a measurement that can be used to identify possible weight problems. It estimates body fat based on height and weight. Your health care provider can help determine your BMI and help you achieve or maintain a healthy weight.  For females 20 years of age and older:  A BMI below 18.5 is considered underweight.  A BMI of 18.5 to 24.9 is normal.  A BMI of 25 to 29.9 is considered overweight.  A BMI of 30 and above is considered obese. Watch levels of cholesterol and blood lipids  You should start having your blood tested for lipids and cholesterol at 33 years of age, then have this test every 5 years.  You may need to have your cholesterol levels checked more often if:  Your lipid or cholesterol levels are high.  You are older than 33 years of age.  You are at high risk for heart disease. Cancer screening Lung Cancer  Lung cancer screening is recommended for adults 55-80 years old who are at high risk for lung cancer because of a history of smoking.  A yearly low-dose CT scan of the lungs is recommended for people who:  Currently smoke.  Have quit within the past 15 years.  Have at least a 30-pack-year history of smoking. A pack year is smoking an average of one pack of cigarettes a day for 1 year.  Yearly screening should continue until it has been 15 years since you quit.  Yearly screening   should stop if you develop a health problem that would prevent you from having lung cancer treatment. Breast Cancer  Practice breast self-awareness. This means understanding how your breasts normally appear and feel.  It also means doing regular breast self-exams. Let your health care provider know about any changes, no matter how small.  If you are in your 20s or 30s, you should have a clinical breast exam (CBE) by a health care provider every 1-3 years as part of a regular health exam.  If you are 40 or older, have a CBE every year. Also  consider having a breast X-ray (mammogram) every year.  If you have a family history of breast cancer, talk to your health care provider about genetic screening.  If you are at high risk for breast cancer, talk to your health care provider about having an MRI and a mammogram every year.  Breast cancer gene (BRCA) assessment is recommended for women who have family members with BRCA-related cancers. BRCA-related cancers include:  Breast.  Ovarian.  Tubal.  Peritoneal cancers.  Results of the assessment will determine the need for genetic counseling and BRCA1 and BRCA2 testing. Cervical Cancer  Your health care provider may recommend that you be screened regularly for cancer of the pelvic organs (ovaries, uterus, and vagina). This screening involves a pelvic examination, including checking for microscopic changes to the surface of your cervix (Pap test). You may be encouraged to have this screening done every 3 years, beginning at age 21.  For women ages 30-65, health care providers may recommend pelvic exams and Pap testing every 3 years, or they may recommend the Pap and pelvic exam, combined with testing for human papilloma virus (HPV), every 5 years. Some types of HPV increase your risk of cervical cancer. Testing for HPV may also be done on women of any age with unclear Pap test results.  Other health care providers may not recommend any screening for nonpregnant women who are considered low risk for pelvic cancer and who do not have symptoms. Ask your health care provider if a screening pelvic exam is right for you.  If you have had past treatment for cervical cancer or a condition that could lead to cancer, you need Pap tests and screening for cancer for at least 20 years after your treatment. If Pap tests have been discontinued, your risk factors (such as having a new sexual partner) need to be reassessed to determine if screening should resume. Some women have medical problems that  increase the chance of getting cervical cancer. In these cases, your health care provider may recommend more frequent screening and Pap tests. Colorectal Cancer  This type of cancer can be detected and often prevented.  Routine colorectal cancer screening usually begins at 33 years of age and continues through 33 years of age.  Your health care provider may recommend screening at an earlier age if you have risk factors for colon cancer.  Your health care provider may also recommend using home test kits to check for hidden blood in the stool.  A small camera at the end of a tube can be used to examine your colon directly (sigmoidoscopy or colonoscopy). This is done to check for the earliest forms of colorectal cancer.  Routine screening usually begins at age 50.  Direct examination of the colon should be repeated every 5-10 years through 33 years of age. However, you may need to be screened more often if early forms of precancerous polyps or small growths   are found. Skin Cancer  Check your skin from head to toe regularly.  Tell your health care provider about any new moles or changes in moles, especially if there is a change in a mole's shape or color.  Also tell your health care provider if you have a mole that is larger than the size of a pencil eraser.  Always use sunscreen. Apply sunscreen liberally and repeatedly throughout the day.  Protect yourself by wearing long sleeves, pants, a wide-brimmed hat, and sunglasses whenever you are outside. Heart disease, diabetes, and high blood pressure  High blood pressure causes heart disease and increases the risk of stroke. High blood pressure is more likely to develop in:  People who have blood pressure in the high end of the normal range (130-139/85-89 mm Hg).  People who are overweight or obese.  People who are African American.  If you are 18-39 years of age, have your blood pressure checked every 3-5 years. If you are 40 years of  age or older, have your blood pressure checked every year. You should have your blood pressure measured twice-once when you are at a hospital or clinic, and once when you are not at a hospital or clinic. Record the average of the two measurements. To check your blood pressure when you are not at a hospital or clinic, you can use:  An automated blood pressure machine at a pharmacy.  A home blood pressure monitor.  If you are between 55 years and 79 years old, ask your health care provider if you should take aspirin to prevent strokes.  Have regular diabetes screenings. This involves taking a blood sample to check your fasting blood sugar level.  If you are at a normal weight and have a low risk for diabetes, have this test once every three years after 33 years of age.  If you are overweight and have a high risk for diabetes, consider being tested at a younger age or more often. Preventing infection Hepatitis B  If you have a higher risk for hepatitis B, you should be screened for this virus. You are considered at high risk for hepatitis B if:  You were born in a country where hepatitis B is common. Ask your health care provider which countries are considered high risk.  Your parents were born in a high-risk country, and you have not been immunized against hepatitis B (hepatitis B vaccine).  You have HIV or AIDS.  You use needles to inject street drugs.  You live with someone who has hepatitis B.  You have had sex with someone who has hepatitis B.  You get hemodialysis treatment.  You take certain medicines for conditions, including cancer, organ transplantation, and autoimmune conditions. Hepatitis C  Blood testing is recommended for:  Everyone born from 1945 through 1965.  Anyone with known risk factors for hepatitis C. Sexually transmitted infections (STIs)  You should be screened for sexually transmitted infections (STIs) including gonorrhea and chlamydia if:  You are  sexually active and are younger than 33 years of age.  You are older than 33 years of age and your health care provider tells you that you are at risk for this type of infection.  Your sexual activity has changed since you were last screened and you are at an increased risk for chlamydia or gonorrhea. Ask your health care provider if you are at risk.  If you do not have HIV, but are at risk, it may be recommended that you take   a prescription medicine daily to prevent HIV infection. This is called pre-exposure prophylaxis (PrEP). You are considered at risk if:  You are sexually active and do not regularly use condoms or know the HIV status of your partner(s).  You take drugs by injection.  You are sexually active with a partner who has HIV. Talk with your health care provider about whether you are at high risk of being infected with HIV. If you choose to begin PrEP, you should first be tested for HIV. You should then be tested every 3 months for as long as you are taking PrEP. Pregnancy  If you are premenopausal and you may become pregnant, ask your health care provider about preconception counseling.  If you may become pregnant, take 400 to 800 micrograms (mcg) of folic acid every day.  If you want to prevent pregnancy, talk to your health care provider about birth control (contraception). Osteoporosis and menopause  Osteoporosis is a disease in which the bones lose minerals and strength with aging. This can result in serious bone fractures. Your risk for osteoporosis can be identified using a bone density scan.  If you are 65 years of age or older, or if you are at risk for osteoporosis and fractures, ask your health care provider if you should be screened.  Ask your health care provider whether you should take a calcium or vitamin D supplement to lower your risk for osteoporosis.  Menopause may have certain physical symptoms and risks.  Hormone replacement therapy may reduce some of  these symptoms and risks. Talk to your health care provider about whether hormone replacement therapy is right for you. Follow these instructions at home:  Schedule regular health, dental, and eye exams.  Stay current with your immunizations.  Do not use any tobacco products including cigarettes, chewing tobacco, or electronic cigarettes.  If you are pregnant, do not drink alcohol.  If you are breastfeeding, limit how much and how often you drink alcohol.  Limit alcohol intake to no more than 1 drink per day for nonpregnant women. One drink equals 12 ounces of beer, 5 ounces of wine, or 1 ounces of hard liquor.  Do not use street drugs.  Do not share needles.  Ask your health care provider for help if you need support or information about quitting drugs.  Tell your health care provider if you often feel depressed.  Tell your health care provider if you have ever been abused or do not feel safe at home. This information is not intended to replace advice given to you by your health care provider. Make sure you discuss any questions you have with your health care provider. Document Released: 05/24/2011 Document Revised: 04/15/2016 Document Reviewed: 08/12/2015 Elsevier Interactive Patient Education  2017 [employer]   

## 2017-03-10 ENCOUNTER — Telehealth: Payer: Self-pay

## 2017-03-10 MED ORDER — ETONOGESTREL-ETHINYL ESTRADIOL 0.12-0.015 MG/24HR VA RING: VAGINAL_RING | VAGINAL | 0 refills | Status: DC

## 2017-03-10 NOTE — Telephone Encounter (Signed)
Pt needs rf on NuvaRing. She is due to remove last ring on next Sat. Aex scheduled 03/31/17. One ring sent to [facility] [address]

## 2017-03-31 ENCOUNTER — Ambulatory Visit (INDEPENDENT_AMBULATORY_CARE_PROVIDER_SITE_OTHER): Payer: BLUE CROSS/BLUE SHIELD | Admitting: Certified Nurse Midwife

## 2017-03-31 ENCOUNTER — Other Ambulatory Visit (HOSPITAL_COMMUNITY)
Admission: RE | Admit: 2017-03-31 | Discharge: 2017-03-31 | Disposition: A | Discharge status: Home / Self Care | Payer: BLUE CROSS/BLUE SHIELD | Source: Ambulatory Visit | Attending: Certified Nurse Midwife | Admitting: Certified Nurse Midwife

## 2017-03-31 ENCOUNTER — Encounter: Payer: Self-pay | Admitting: Certified Nurse Midwife

## 2017-03-31 VITALS — BP 122/78 | HR 57 | Ht 60.0 in | Wt 168.7 lb

## 2017-03-31 DIAGNOSIS — Z01419 Encounter for gynecological examination (general) (routine) without abnormal findings: Secondary | ICD-10-CM

## 2017-03-31 DIAGNOSIS — B373 Candidiasis of vulva and vagina: Secondary | ICD-10-CM

## 2017-03-31 DIAGNOSIS — Z3044 Encounter for surveillance of vaginal ring hormonal contraceptive device: Secondary | ICD-10-CM

## 2017-03-31 DIAGNOSIS — R87810 Cervical high risk human papillomavirus (HPV) DNA test positive: Secondary | ICD-10-CM | POA: Diagnosis not present

## 2017-03-31 DIAGNOSIS — Z124 Encounter for screening for malignant neoplasm of cervix: Secondary | ICD-10-CM | POA: Diagnosis not present

## 2017-03-31 NOTE — Progress Notes (Signed)
Patient is in the office for annual exam, declined std testing. LMP 03-23-17, patient is currently on NuvaRing.

## 2017-03-31 NOTE — Progress Notes (Signed)
Subjective:        Amy Miles is a 31 y.o. female here for a routine annual exam.  Patient is 31 year old  and is happy that she lost 10 lbs since last visit and exercise four times a month.  Patient is currently sexually active and denies using condoms. Has used  OCP, Mirena, and condoms in the past. Patient is currently on the Nuva-Ring and likes it. Patient denies any voiding issues ,rectal bleeding or hx of hemmoroids.  Her monthly menstral cycle that last 5 days with occasional dysmenorrhea, denies having any clots.  Personal health questionnaire:  Is patient [name], have a family history of breast and/or ovarian cancer: yes   MGM, BCA., after menopause Maternal aunt ovarian cancer  Is there a family history of uterine cancer diagnosed at age < 42, gastrointestinal cancer, urinary tract cancer, family member who is a Personnel officer syndrome-associated carrier: yes, father deceased from bladder cancer., MGF, thoat and lung cancer  Is the patient overweight and hypertensive, family history of diabetes, personal history of gestational diabetes, preeclampsia or PCOS: yes Is patient over 41, have PCOS,  family history of premature CHD under age 5, diabetes, smoke, have hypertension or peripheral artery disease:  no At any time, has a partner hit, kicked or otherwise hurt or frightened you?: no Over the past 2 weeks, have you felt down, depressed or hopeless?: no Over the past 2 weeks, have you felt little interest or pleasure in doing things?:no   Gynecologic History Patient's last menstrual period was 03/23/2017. Contraception: Nuva-ring vaginal Inserts  Last Pap: 01/16/2016. Results were: normal Last mammogram: N/A.   Obstetric History OB History  Gravida Para Term Preterm AB Living  4 1 1   3 1   SAB TAB Ectopic Multiple Live Births  1 2     1     # Outcome Date GA Lbr Len/2nd Weight Sex Delivery Anes PTL Lv  4 Term 03/31/06     Vag-Spont   LIV  3 TAB           2 TAB            1 SAB               Past Medical History:  Diagnosis Date  . Chicken pox     History reviewed. No pertinent surgical history.   Current Outpatient Prescriptions:  .  etonogestrel-ethinyl estradiol (NUVARING) 0.12-0.015 MG/24HR vaginal ring, Insert vaginally and leave in place for 3 consecutive weeks, then remove for 1 week., Disp: 3 each, Rfl: 4 .  etonogestrel-ethinyl estradiol (NUVARING) 0.12-0.015 MG/24HR vaginal ring, Insert vaginally and leave in place for 3 consecutive weeks, then remove for 1 week., Disp: 1 each, Rfl: 0 No Known Allergies  Social History  Substance Use Topics  . Smoking status: Never Smoker  . Smokeless tobacco: Never Used  . Alcohol use 0.0 oz/week     Comment: Social / Occassional    Family History  Problem Relation Age of Onset  . Pancreatic cancer Father   . Healthy Mother   . Breast cancer Maternal Grandmother   . Diabetes Maternal Grandmother   . Stroke Maternal Grandmother   . Heart disease Paternal Grandmother   . Lung cancer Paternal Grandfather   . Throat cancer Paternal Grandfather       Review of Systems  Constitutional: negative for fatigue and weight loss Respiratory: negative for cough and wheezing Cardiovascular: negative for chest pain, fatigue and palpitations Gastrointestinal:  negative for abdominal pain and change in bowel habits Musculoskeletal:negative for myalgias Neurological: negative for gait problems and tremors Behavioral/Psych: negative for abusive relationship, depression Endocrine: negative for temperature intolerance    Genitourinary:negative for abnormal menstrual periods, genital lesions, hot flashes, sexual problems and vaginal discharge Integument/breast: negative for breast lump, breast tenderness, nipple discharge and skin lesion(s)    Objective:       BP 122/78   Pulse (!) 57   Ht 5' (1.524 m)   Wt 168 lb 11.2 oz (76.5 kg)   LMP 03/23/2017   BMI 32.95 kg/m  General:   alert  Skin:   no  rash or abnormalities  Lungs:   clear to auscultation bilaterally  Heart:   regular rate and rhythm, S1, S2 normal, no murmur, click, rub or gallop  Breasts:   normal without suspicious masses, skin or nipple changes or axillary nodes  Abdomen:  normal findings: no organomegaly, soft, non-tender and no hernia  Pelvis:  External genitalia: normal general appearance Urinary system: urethral meatus normal and bladder without fullness, nontender Vaginal: normal without tenderness, induration or masses Cervix: normal appearance Adnexa: normal bimanual exam Uterus: anteverted and non-tender, normal size   Lab Review Urine pregnancy test Labs reviewed yes Radiologic studies reviewed no  50% of 30 min visit spent on counseling and coordination of care.    Assessment:    Healthy female exam.  Contraception Surveillance refill rx for Nuva-Ring,Family hx of cancers ,Declines STD testing   Plan:    Education reviewed: calcium supplements, depression evaluation, low fat, low cholesterol diet, safe sex/STD prevention, self breast exams, skin cancer screening and weight bearing exercise. Contraception: NuvaRing vaginal inserts. Follow up in: 1 year.   Women's annual routine gynecological examination - Plan: Cytology - PAP, Cervicovaginal ancillary only Possible management options include OCP Follow-up as needed..Marland Kitchen

## 2017-04-01 LAB — CERVICOVAGINAL ANCILLARY ONLY
Bacterial vaginitis: NEGATIVE
Candida vaginitis: POSITIVE — AB

## 2017-04-03 ENCOUNTER — Other Ambulatory Visit: Payer: Self-pay | Admitting: Certified Nurse Midwife

## 2017-04-03 ENCOUNTER — Encounter: Payer: Self-pay | Admitting: Certified Nurse Midwife

## 2017-04-03 DIAGNOSIS — B373 Candidiasis of vulva and vagina: Secondary | ICD-10-CM

## 2017-04-03 DIAGNOSIS — B3731 Acute candidiasis of vulva and vagina: Secondary | ICD-10-CM

## 2017-04-03 MED ORDER — TERCONAZOLE 0.8 % VA CREA: 1.0000 | TOPICAL_CREAM | Freq: Every day | VAGINAL | 0 refills | Status: DC

## 2017-04-03 MED ORDER — FLUCONAZOLE 200 MG PO TABS: 200.0000 mg | ORAL_TABLET | Freq: Once | ORAL | 0 refills | Status: AC

## 2017-04-03 MED ORDER — ETONOGESTREL-ETHINYL ESTRADIOL 0.12-0.015 MG/24HR VA RING: VAGINAL_RING | VAGINAL | 0 refills | Status: DC

## 2017-04-05 ENCOUNTER — Telehealth: Payer: Self-pay | Admitting: *Deleted

## 2017-04-05 ENCOUNTER — Other Ambulatory Visit: Payer: Self-pay | Admitting: Certified Nurse Midwife

## 2017-04-05 LAB — CYTOLOGY - PAP
Diagnosis: NEGATIVE
HPV 16/18/45 genotyping: NEGATIVE
HPV: DETECTED — AB

## 2017-04-05 NOTE — Telephone Encounter (Signed)
Fax request sent to [facility] for add on HPV testing per R.Denney request.

## 2017-04-14 ENCOUNTER — Other Ambulatory Visit: Payer: Self-pay | Admitting: Certified Nurse Midwife

## 2017-05-12 ENCOUNTER — Other Ambulatory Visit: Payer: Self-pay | Admitting: Certified Nurse Midwife

## 2017-05-12 ENCOUNTER — Telehealth: Payer: Self-pay

## 2017-05-12 DIAGNOSIS — Z30018 Encounter for initial prescription of other contraceptives: Secondary | ICD-10-CM

## 2017-05-12 DIAGNOSIS — Z3044 Encounter for surveillance of vaginal ring hormonal contraceptive device: Secondary | ICD-10-CM

## 2017-05-12 MED ORDER — ETONOGESTREL-ETHINYL ESTRADIOL 0.12-0.015 MG/24HR VA RING: VAGINAL_RING | VAGINAL | 4 refills | Status: DC

## 2017-05-12 NOTE — Telephone Encounter (Signed)
Advised would send refill for nuvaring with provider approval.

## 2017-07-05 ENCOUNTER — Encounter: Payer: Self-pay | Admitting: Family

## 2017-07-05 ENCOUNTER — Ambulatory Visit (INDEPENDENT_AMBULATORY_CARE_PROVIDER_SITE_OTHER): Payer: BLUE CROSS/BLUE SHIELD | Admitting: Family

## 2017-07-05 DIAGNOSIS — R059 Cough, unspecified: Secondary | ICD-10-CM | POA: Insufficient documentation

## 2017-07-05 DIAGNOSIS — R21 Rash and other nonspecific skin eruption: Secondary | ICD-10-CM | POA: Diagnosis not present

## 2017-07-05 DIAGNOSIS — R05 Cough: Secondary | ICD-10-CM | POA: Diagnosis not present

## 2017-07-05 MED ORDER — TRIAMCINOLONE ACETONIDE 0.1 % EX CREA: 1.0000 "application " | TOPICAL_CREAM | Freq: Two times a day (BID) | CUTANEOUS | 0 refills | Status: DC

## 2017-07-05 MED ORDER — PANTOPRAZOLE SODIUM 40 MG PO TBEC: 40.0000 mg | DELAYED_RELEASE_TABLET | Freq: Every day | ORAL | 0 refills | Status: DC

## 2017-07-05 NOTE — Patient Instructions (Addendum)
Thank you for choosing [facility].  SUMMARY AND INSTRUCTIONS:  Start the pantoprazole for the cough and potential reflux.  Start the triamcinolone cream for the rash.  Moisturize with lotions.   Follow up if symptoms worsen.   Medication:  Your prescription(s) have been submitted to your pharmacy or been printed and provided for you. Please take as directed and contact our office if you believe you are having problem(s) with the medication(s) or have any questions.  Follow up:  If your symptoms worsen or fail to improve, please contact our office for further instruction, or in case of emergency go directly to the emergency room at the closest medical facility.

## 2017-07-05 NOTE — Assessment & Plan Note (Signed)
Rash appears to be healing with cortisone cream and consistent with dermatitis. Start triamcinolone. Follow up if symptoms worsen or do not improve.

## 2017-07-05 NOTE — Progress Notes (Signed)
Subjective:    Patient ID: Amy Miles, female    DOB: 1986/10/28, 31 y.o.   MRN: [id number]  Chief Complaint  Patient presents with  . Cough    x4 weeks has had a cough that occurs mostly at night and there is some wheezing, rash on chest and arms     HPI:  Amy Miles is a 31 y.o. female who  has a past medical history of Chicken pox. and presents today for an acute office visit.  This is a new problem. Associated symptom of cough that occurs primarily at night has been going on for about 1 month. No fevers or chills. Modifying factors include Mucinex, and Theraflu which have helped a little. No symptoms of heartburn that she is aware of. Describes some wheezing at times. Also notes a rash located on her chest and arms described as red itchy that has been going on for about 3 weeks. Modifying factors include hydrocortisone cream. Course of the symptoms is improving since initial onset.   No Known Allergies    Outpatient Medications Prior to Visit  Medication Sig Dispense Refill  . etonogestrel-ethinyl estradiol (NUVARING) 0.12-0.015 MG/24HR vaginal ring Insert vaginally and leave in place for 3 consecutive weeks, then remove for 1 week. 1 each 4  . terconazole (TERAZOL 3) 0.8 % vaginal cream Place 1 applicator vaginally at bedtime. 20 g 0   No facility-administered medications prior to visit.       No past surgical history on file.    Past Medical History:  Diagnosis Date  . Chicken pox       Review of Systems  Constitutional: Negative for chills and fever.  Respiratory: Negative for chest tightness and shortness of breath.   Cardiovascular: Negative for chest pain, palpitations and leg swelling.      Objective:    BP 134/78 (BP Location: Left Arm, Patient Position: Sitting, Cuff Size: Large)   Pulse 61   Temp 98.5 F (36.9 C) (Oral)   Resp 16   Ht 5' (1.524 m)   Wt 174 lb 9.6 oz (79.2 kg)   SpO2 95%   BMI 34.10 kg/m  Nursing note and vital  signs reviewed.  Physical Exam  Constitutional: She is oriented to person, place, and time. She appears well-developed and well-nourished. No distress.  HENT:  Right Ear: Hearing, tympanic membrane, external ear and ear canal normal.  Left Ear: Hearing, tympanic membrane, external ear and ear canal normal.  Nose: Nose normal.  Mouth/Throat: Uvula is midline, oropharynx is clear and moist and mucous membranes are normal.  Cardiovascular: Normal rate, regular rhythm, normal heart sounds and intact distal pulses.   Pulmonary/Chest: Effort normal and breath sounds normal.  Neurological: She is alert and oriented to person, place, and time.  Skin: Skin is warm and dry.  Mildly red rash with small papules located on her chest and arms in various stages of healing following itching. No evidence of discharge or infection.   Psychiatric: She has a normal mood and affect. Her behavior is normal. Judgment and thought content normal.       Assessment & Plan:   Problem List Items Addressed This Visit      Musculoskeletal and Integument   Rash    Rash appears to be healing with cortisone cream and consistent with dermatitis. Start triamcinolone. Follow up if symptoms worsen or do not improve.         Other   Cough    Symptoms  and exam are consistent with reflux. Start trial of pantoprazole. Continue over the counter medications as needed for symptom relief and supportive care.           I am having Ms. Kotz start on pantoprazole and triamcinolone cream. I am also having her maintain her terconazole and etonogestrel-ethinyl estradiol.   Meds ordered this encounter  Medications  . pantoprazole (PROTONIX) 40 MG tablet    Sig: Take 1 tablet (40 mg total) by mouth daily.    Dispense:  14 tablet    Refill:  0    Order Specific Question:   Supervising Provider    Answer:   Hillard DankerRAWFORD, ELIZABETH A [[id number]]  . triamcinolone cream (KENALOG) 0.1 %    Sig: Apply 1 application topically 2 (two)  times daily.    Dispense:  30 g    Refill:  0    Order Specific Question:   Supervising Provider    Answer:   Hillard DankerRAWFORD, ELIZABETH A [[id number]]     Follow-up: Return if symptoms worsen or fail to improve.  Jeanine Luzalone, Gregory, FNP

## 2017-07-05 NOTE — Assessment & Plan Note (Signed)
Symptoms and exam are consistent with reflux. Start trial of pantoprazole. Continue over the counter medications as needed for symptom relief and supportive care.

## 2017-09-23 ENCOUNTER — Telehealth: Payer: Self-pay | Admitting: Family

## 2017-09-23 NOTE — Telephone Encounter (Signed)
Patient requesting immunization record.

## 2017-09-23 NOTE — Telephone Encounter (Signed)
Printed immunization list notified pt will pick-up on Monday...Amy Miles/lmb

## 2017-10-06 ENCOUNTER — Other Ambulatory Visit: Payer: Self-pay

## 2017-10-06 ENCOUNTER — Telehealth: Payer: Self-pay

## 2017-10-06 ENCOUNTER — Other Ambulatory Visit: Payer: Self-pay | Admitting: Certified Nurse Midwife

## 2017-10-06 DIAGNOSIS — Z3044 Encounter for surveillance of vaginal ring hormonal contraceptive device: Secondary | ICD-10-CM

## 2017-10-06 MED ORDER — ETONOGESTREL-ETHINYL ESTRADIOL 0.12-0.015 MG/24HR VA RING: VAGINAL_RING | VAGINAL | 4 refills | Status: DC

## 2017-10-06 NOTE — Telephone Encounter (Signed)
S/w patient and advised that refill request was sent to provider for review.

## 2017-10-06 NOTE — Telephone Encounter (Signed)
Returned call, no answer, left vm and routed to provider for review for refill.

## 2018-02-15 ENCOUNTER — Encounter: Payer: BLUE CROSS/BLUE SHIELD | Admitting: Nurse Practitioner

## 2018-02-23 ENCOUNTER — Institutional Professional Consult (permissible substitution): Payer: BLUE CROSS/BLUE SHIELD | Admitting: Pulmonary Disease

## 2018-02-27 ENCOUNTER — Other Ambulatory Visit: Payer: Self-pay

## 2018-02-27 ENCOUNTER — Telehealth: Payer: Self-pay

## 2018-02-27 DIAGNOSIS — Z3044 Encounter for surveillance of vaginal ring hormonal contraceptive device: Secondary | ICD-10-CM

## 2018-02-27 MED ORDER — ETONOGESTREL-ETHINYL ESTRADIOL 0.12-0.015 MG/24HR VA RING: VAGINAL_RING | VAGINAL | 3 refills | Status: DC

## 2018-02-27 NOTE — Telephone Encounter (Signed)
Left VM message to call office.

## 2018-03-29 ENCOUNTER — Institutional Professional Consult (permissible substitution): Payer: BLUE CROSS/BLUE SHIELD | Admitting: Pulmonary Disease

## 2018-03-29 DIAGNOSIS — J45909 Unspecified asthma, uncomplicated: Secondary | ICD-10-CM | POA: Insufficient documentation

## 2018-04-12 ENCOUNTER — Encounter: Payer: Self-pay | Admitting: Certified Nurse Midwife

## 2018-04-12 ENCOUNTER — Telehealth: Payer: Self-pay

## 2018-04-12 ENCOUNTER — Ambulatory Visit (INDEPENDENT_AMBULATORY_CARE_PROVIDER_SITE_OTHER): Payer: 59 | Admitting: Certified Nurse Midwife

## 2018-04-12 VITALS — BP 132/83 | HR 67 | Ht 60.0 in | Wt 178.8 lb

## 2018-04-12 DIAGNOSIS — Z01419 Encounter for gynecological examination (general) (routine) without abnormal findings: Secondary | ICD-10-CM | POA: Diagnosis not present

## 2018-04-12 DIAGNOSIS — Z1151 Encounter for screening for human papillomavirus (HPV): Secondary | ICD-10-CM

## 2018-04-12 DIAGNOSIS — Z8619 Personal history of other infectious and parasitic diseases: Secondary | ICD-10-CM | POA: Diagnosis not present

## 2018-04-12 DIAGNOSIS — Z3044 Encounter for surveillance of vaginal ring hormonal contraceptive device: Secondary | ICD-10-CM | POA: Diagnosis not present

## 2018-04-12 DIAGNOSIS — N898 Other specified noninflammatory disorders of vagina: Secondary | ICD-10-CM

## 2018-04-12 DIAGNOSIS — E669 Obesity, unspecified: Secondary | ICD-10-CM

## 2018-04-12 DIAGNOSIS — Z124 Encounter for screening for malignant neoplasm of cervix: Secondary | ICD-10-CM | POA: Diagnosis not present

## 2018-04-12 MED ORDER — ETONOGESTREL-ETHINYL ESTRADIOL 0.12-0.015 MG/24HR VA RING: VAGINAL_RING | VAGINAL | 4 refills | Status: DC

## 2018-04-12 NOTE — Progress Notes (Signed)
Subjective:        Amy Miles is a 32 y.o. female here for a routine exam.  Current complaints: weight gain, states normal periods, no change in sexual partners, declines STD screening exam.  Is happy with Nuva Ring.  Is employed as an EMT currently.  Has not been exercising.  Diet discussed.      Personal health questionnaire:  Is patient [name], have a family history of breast and/or ovarian cancer: MGM: breast CA Is there a family history of uterine cancer diagnosed at age < 32, gastrointestinal cancer, urinary tract cancer, family member who is a Personnel officer syndrome-associated carrier: no Is the patient overweight and hypertensive, family history of diabetes, personal history of gestational diabetes, preeclampsia or PCOS: yes Is patient over 34, have PCOS,  family history of premature CHD under age 31, diabetes, smoke, have hypertension or peripheral artery disease:  no At any time, has a partner hit, kicked or otherwise hurt or frightened you?: no Over the past 2 weeks, have you felt down, depressed or hopeless?: no Over the past 2 weeks, have you felt little interest or pleasure in doing things?:not asked   Gynecologic History Patient's last menstrual period was 03/24/2018. Contraception: NuvaRing vaginal inserts Last Pap: 03/31/17. Results were: +HPV, negative 16/18/45 Last mammogram: n/a <40 years, no significant family history   Obstetric History OB History  Gravida Para Term Preterm AB Living  SAB TAB Ectopic Multiple Live Births  # Outcome Date GA Lbr Len/2nd Weight Sex Delivery Anes PTL Lv  4 Term 03/31/06     Vag-Spont   LIV  3 TAB           2 TAB           1 SAB             Past Medical History:  Diagnosis Date  . Chicken pox     History reviewed. No pertinent surgical history.   Current Outpatient Medications:  .  cetirizine (ZYRTEC) 5 MG tablet, Take 5 mg by mouth daily., Disp: , Rfl:  .  etonogestrel-ethinyl  estradiol (NUVARING) 0.12-0.015 MG/24HR vaginal ring, Insert vaginally and leave in place for 3 consecutive weeks, then remove for 1 week., Disp: 3 each, Rfl: 4 .  pantoprazole (PROTONIX) 40 MG tablet, Take 1 tablet (40 mg total) by mouth daily. (Patient not taking: Reported on 04/12/2018), Disp: 14 tablet, Rfl: 0 .  terconazole (TERAZOL 3) 0.8 % vaginal cream, Place 1 applicator vaginally at bedtime. (Patient not taking: Reported on 04/12/2018), Disp: 20 g, Rfl: 0 .  triamcinolone cream (KENALOG) 0.1 %, Apply 1 application topically 2 (two) times daily. (Patient not taking: Reported on 04/12/2018), Disp: 30 g, Rfl: 0 No Known Allergies  Social History   Tobacco Use  . Smoking status: Never Smoker  . Smokeless tobacco: Never Used  Substance Use Topics  . Alcohol use: Yes    Alcohol/week: 0.0 oz    Comment: Social / Occassional    Family History  Problem Relation Age of Onset  . Pancreatic cancer Father   . Healthy Mother   . Breast cancer Maternal Grandmother   . Diabetes Maternal Grandmother   . Stroke Maternal Grandmother   . Heart disease Paternal Grandmother   . Lung cancer Paternal Grandfather   . Throat cancer Paternal Grandfather       Review of Systems  Constitutional: negative for fatigue and weight loss Respiratory: negative for cough and wheezing Cardiovascular: negative for chest pain, fatigue and palpitations Gastrointestinal: negative for abdominal pain and change in bowel habits Musculoskeletal:negative for myalgias Neurological: negative for gait problems and tremors Behavioral/Psych: negative for abusive relationship, depression Endocrine: negative for temperature intolerance    Genitourinary:negative for abnormal menstrual periods, genital lesions, hot flashes, sexual problems and vaginal discharge Integument/breast: negative for breast lump, breast tenderness, nipple discharge and skin lesion(s)    Objective:       BP 132/83   Pulse 67   Ht 5' (1.524 m)    Wt 178 lb 12.8 oz (81.1 kg)   LMP 03/24/2018   BMI 34.92 kg/m  General:   alert  Skin:   no rash or abnormalities  Lungs:   clear to auscultation bilaterally  Heart:   regular rate and rhythm, S1, S2 normal, no murmur, click, rub or gallop  Breasts:   normal without suspicious masses, skin or nipple changes or axillary nodes  Abdomen:  normal findings: no organomegaly, soft, non-tender and no hernia  Pelvis:  External genitalia: normal general appearance Urinary system: urethral meatus normal and bladder without fullness, nontender Vaginal: normal without tenderness, induration or masses, + thin gray vaginal discharge Cervix: ectropic appearance, friable on exam Adnexa: normal bimanual exam Uterus: anteverted and non-tender, normal size   Lab Review Urine pregnancy test Labs reviewed yes Radiologic studies reviewed no  50% of 30 min visit spent on counseling and coordination of care.   Assessment & Plan    Healthy female exam.    1. Encounter for well woman exam with routine gynecological exam      - Cytology - PAP - CBC with Differential/Platelet - TSH - Cholesterol, total - Triglycerides - HDL cholesterol - Hemoglobin A1c  2. Obesity (BMI 30.0-34.9)     - CBC with Differential/Platelet - TSH - Cholesterol, total - Triglycerides - HDL cholesterol - Hemoglobin A1c  3. Encounter for surveillance of vaginal ring hormonal contraceptive device     - etonogestrel-ethinyl estradiol (NUVARING) 0.12-0.015 MG/24HR vaginal ring; Insert vaginally and leave in place for 3 consecutive weeks, then remove for 1 week.  Dispense: 3 each; Refill: 4  4. Vaginal discharge    - Cervicovaginal ancillary only    Education reviewed: calcium supplements, depression evaluation, low fat, low cholesterol diet, safe sex/STD prevention, self breast exams, skin cancer screening and weight bearing exercise. Contraception: NuvaRing vaginal inserts. Follow up in: 1 years.   Meds ordered  this encounter  Medications  . etonogestrel-ethinyl estradiol (NUVARING) 0.12-0.015 MG/24HR vaginal ring    Sig: Insert vaginally and leave in place for 3 consecutive weeks, then remove for 1 week.    Dispense:  3 each    Refill:  4   Orders Placed This Encounter  Procedures  . CBC with Differential/Platelet  . TSH  . Cholesterol, total  . Triglycerides  . HDL cholesterol  . Hemoglobin A1c    Possible management options include:Gardasil series for HPV Follow up as needed.

## 2018-04-12 NOTE — Progress Notes (Signed)
Patient is in the office for annual, last pap 03-31-17. Pt denies any abnormal symptoms.

## 2018-04-12 NOTE — Telephone Encounter (Signed)
Patient needs appointments to get Gardasil.  Unable to leave message phone rang, no answer then busy signal.

## 2018-04-12 NOTE — Telephone Encounter (Signed)
-----   Message from Roe Coombs, CNM sent at 04/12/2018 11:07 AM EDT ----- Regarding: HPV Gardasil series Needs gardasil series.

## 2018-04-13 LAB — CBC WITH DIFFERENTIAL/PLATELET
Basophils Absolute: 0 10*3/uL (ref 0.0–0.2)
Basos: 1 %
EOS (ABSOLUTE): 0.4 10*3/uL (ref 0.0–0.4)
Eos: 9 %
Hematocrit: 37 % (ref 34.0–46.6)
Hemoglobin: 11.8 g/dL (ref 11.1–15.9)
Immature Grans (Abs): 0 10*3/uL (ref 0.0–0.1)
Immature Granulocytes: 0 %
Lymphocytes Absolute: 2.1 10*3/uL (ref 0.7–3.1)
Lymphs: 50 %
MCH: 26.8 pg (ref 26.6–33.0)
MCHC: 31.9 g/dL (ref 31.5–35.7)
MCV: 84 fL (ref 79–97)
Monocytes Absolute: 0.3 10*3/uL (ref 0.1–0.9)
Monocytes: 6 %
Neutrophils Absolute: 1.4 10*3/uL (ref 1.4–7.0)
Neutrophils: 34 %
Platelets: 316 10*3/uL (ref 150–450)
RBC: 4.4 x10E6/uL (ref 3.77–5.28)
RDW: 14 % (ref 12.3–15.4)
WBC: 4.2 10*3/uL (ref 3.4–10.8)

## 2018-04-13 LAB — HEMOGLOBIN A1C
Est. average glucose Bld gHb Est-mCnc: 105 mg/dL
Hgb A1c MFr Bld: 5.3 % (ref 4.8–5.6)

## 2018-04-13 LAB — CHOLESTEROL, TOTAL: Cholesterol, Total: 159 mg/dL (ref 100–199)

## 2018-04-13 LAB — TRIGLYCERIDES: Triglycerides: 68 mg/dL (ref 0–149)

## 2018-04-13 LAB — HDL CHOLESTEROL: HDL: 65 mg/dL (ref >39)

## 2018-04-13 LAB — TSH: TSH: 0.764 u[IU]/mL (ref 0.450–4.500)

## 2018-04-13 LAB — CERVICOVAGINAL ANCILLARY ONLY
Bacterial vaginitis: NEGATIVE
Candida vaginitis: NEGATIVE

## 2018-04-24 LAB — CYTOLOGY - PAP
Diagnosis: NEGATIVE
HPV 16/18/45 genotyping: NEGATIVE
HPV: DETECTED — AB

## 2018-04-25 ENCOUNTER — Other Ambulatory Visit: Payer: Self-pay | Admitting: Certified Nurse Midwife

## 2018-04-25 DIAGNOSIS — R87820 Cervical low risk human papillomavirus (HPV) DNA test positive: Secondary | ICD-10-CM | POA: Insufficient documentation

## 2018-05-01 ENCOUNTER — Ambulatory Visit (INDEPENDENT_AMBULATORY_CARE_PROVIDER_SITE_OTHER): Payer: 59

## 2018-05-01 VITALS — BP 127/84 | HR 69 | Wt 177.0 lb

## 2018-05-01 DIAGNOSIS — Z23 Encounter for immunization: Secondary | ICD-10-CM

## 2018-05-01 DIAGNOSIS — R87810 Cervical high risk human papillomavirus (HPV) DNA test positive: Secondary | ICD-10-CM | POA: Diagnosis not present

## 2018-05-01 DIAGNOSIS — IMO0001 Reserved for inherently not codable concepts without codable children: Secondary | ICD-10-CM

## 2018-05-01 NOTE — Progress Notes (Signed)
I have reviewed the chart and agree with nursing staff's documentation of this patient's encounter.  Roe CoombsRachelle A Denney, CNM 05/01/2018 1:44 PM

## 2018-05-01 NOTE — Progress Notes (Signed)
Pt here for first Gardasil vaccine. Pt tolerated well. Pt advised to come back in 2 months for 2nd dose and 4 months for the 3rd dose. Pt verbalized understanding.

## 2018-06-30 ENCOUNTER — Ambulatory Visit (INDEPENDENT_AMBULATORY_CARE_PROVIDER_SITE_OTHER): Payer: 59 | Admitting: *Deleted

## 2018-06-30 VITALS — BP 129/80 | HR 76 | Wt 177.0 lb

## 2018-06-30 DIAGNOSIS — Z23 Encounter for immunization: Secondary | ICD-10-CM | POA: Diagnosis not present

## 2018-06-30 DIAGNOSIS — R87810 Cervical high risk human papillomavirus (HPV) DNA test positive: Secondary | ICD-10-CM | POA: Diagnosis not present

## 2018-06-30 DIAGNOSIS — IMO0001 Reserved for inherently not codable concepts without codable children: Secondary | ICD-10-CM

## 2018-06-30 NOTE — Progress Notes (Signed)
Pt is in office for 2nd Gardasil series injection. Pt states no adverse reactions to initial injection. Injection given, pt tolerated well. Pt advised to RTO in 4 months for final injection.  Pt has no other concerns today.   BP 129/80   Pulse 76   Wt 177 lb (80.3 kg)   BMI 34.57 kg/m   Immunization History  Administered Date(s) Administered  . HPV 9-valent 06/30/2018  . HPV Quadrivalent 05/01/2018  . Tdap 02/14/2017

## 2018-10-30 ENCOUNTER — Ambulatory Visit (INDEPENDENT_AMBULATORY_CARE_PROVIDER_SITE_OTHER): Payer: 59 | Admitting: *Deleted

## 2018-10-30 VITALS — BP 125/75 | HR 67 | Wt 189.0 lb

## 2018-10-30 DIAGNOSIS — R87810 Cervical high risk human papillomavirus (HPV) DNA test positive: Secondary | ICD-10-CM | POA: Diagnosis not present

## 2018-10-30 DIAGNOSIS — IMO0001 Reserved for inherently not codable concepts without codable children: Secondary | ICD-10-CM

## 2018-10-30 DIAGNOSIS — Z23 Encounter for immunization: Secondary | ICD-10-CM

## 2018-10-30 NOTE — Progress Notes (Signed)
Pt is in office for 3rd and final Gardasil injection.  Pt tolerated injection well.   Pt has no other concerns today. Pt advised to follow up as needed.  BP 125/75   Pulse 67   Wt 189 lb (85.7 kg)   BMI 36.91 kg/m   Immunization History  Administered Date(s) Administered  . HPV 9-valent 06/30/2018, 10/30/2018  . HPV Quadrivalent 05/01/2018  . Tdap 02/14/2017

## 2018-11-29 ENCOUNTER — Other Ambulatory Visit: Payer: Self-pay | Admitting: *Deleted

## 2018-11-29 DIAGNOSIS — Z3044 Encounter for surveillance of vaginal ring hormonal contraceptive device: Secondary | ICD-10-CM

## 2018-11-29 MED ORDER — ETONOGESTREL-ETHINYL ESTRADIOL 0.12-0.015 MG/24HR VA RING: VAGINAL_RING | VAGINAL | 4 refills | Status: DC

## 2018-11-29 NOTE — Progress Notes (Signed)
Pt called to office for refill on Nuva Ring. Pt is up to date on annual.  Refill sent, pt advised she will need annual after 04/13/2019.

## 2019-12-31 ENCOUNTER — Other Ambulatory Visit: Payer: Self-pay

## 2019-12-31 ENCOUNTER — Encounter: Payer: Self-pay | Admitting: *Deleted

## 2019-12-31 DIAGNOSIS — Z3044 Encounter for surveillance of vaginal ring hormonal contraceptive device: Secondary | ICD-10-CM

## 2019-12-31 MED ORDER — ETONOGESTREL-ETHINYL ESTRADIOL 0.12-0.015 MG/24HR VA RING: VAGINAL_RING | VAGINAL | 4 refills | Status: DC

## 2020-02-12 ENCOUNTER — Ambulatory Visit: Payer: 59 | Admitting: Family Medicine

## 2020-03-13 ENCOUNTER — Ambulatory Visit (INDEPENDENT_AMBULATORY_CARE_PROVIDER_SITE_OTHER): Payer: 59 | Admitting: Obstetrics and Gynecology

## 2020-03-13 ENCOUNTER — Encounter: Payer: Self-pay | Admitting: Obstetrics and Gynecology

## 2020-03-13 ENCOUNTER — Other Ambulatory Visit: Payer: Self-pay

## 2020-03-13 ENCOUNTER — Other Ambulatory Visit (HOSPITAL_COMMUNITY)
Admission: RE | Admit: 2020-03-13 | Discharge: 2020-03-13 | Disposition: A | Discharge status: Home / Self Care | Payer: 59 | Source: Ambulatory Visit | Attending: Family Medicine | Admitting: Family Medicine

## 2020-03-13 VITALS — BP 138/84 | HR 61 | Wt 191.0 lb

## 2020-03-13 DIAGNOSIS — Z01419 Encounter for gynecological examination (general) (routine) without abnormal findings: Secondary | ICD-10-CM

## 2020-03-13 DIAGNOSIS — Z3044 Encounter for surveillance of vaginal ring hormonal contraceptive device: Secondary | ICD-10-CM

## 2020-03-13 MED ORDER — ETONOGESTREL-ETHINYL ESTRADIOL 0.12-0.015 MG/24HR VA RING: VAGINAL_RING | VAGINAL | 4 refills | Status: DC

## 2020-03-13 NOTE — Progress Notes (Signed)
Subjective:     Amy Miles is a 34 y.o. female P108 with LMP 12/27/2019 who is here for a comprehensive physical exam. The patient reports no problems. She is sexually active using Nuvaring for contraception. Patient admits to using it continuously at times as to skip periods. She denies pelvic pain or abnormal discharge. Patient denies urinary incontinence. Patient is not interested in STI testing. Patient is without complaints  Past Medical History:  Diagnosis Date  . Chicken pox    No past surgical history on file. Family History  Problem Relation Age of Onset  . Pancreatic cancer Father   . Healthy Mother   . Breast cancer Maternal Grandmother   . Diabetes Maternal Grandmother   . Stroke Maternal Grandmother   . Heart disease Paternal Grandmother   . Lung cancer Paternal Grandfather   . Throat cancer Paternal Grandfather     Social History   Socioeconomic History  . Marital status: Single    Spouse name: Not on file  . Number of children: 1  . Years of education: 78  . Highest education level: Not on file  Occupational History  . Occupation: Chemical engineer  Tobacco Use  . Smoking status: Never Smoker  . Smokeless tobacco: Never Used  Substance and Sexual Activity  . Alcohol use: Yes    Alcohol/week: 0.0 standard drinks    Comment: Social / Physicist, medical  . Drug use: No  . Sexual activity: Yes    Partners: Male    Birth control/protection: None, Inserts  Other Topics Concern  . Not on file  Social History Narrative   Fun: Play outside with her daughter, watch TV   Denies abuse and feels safe at home.    Social Determinants of Health   Financial Resource Strain:   . Difficulty of Paying Living Expenses:   Food Insecurity:   . Worried About Charity fundraiser in the Last Year:   . Arboriculturist in the Last Year:   Transportation Needs:   . Film/video editor (Medical):   Marland Kitchen Lack of Transportation (Non-Medical):   Physical Activity:   . Days of  Exercise per Week:   . Minutes of Exercise per Session:   Stress:   . Feeling of Stress :   Social Connections:   . Frequency of Communication with Friends and Family:   . Frequency of Social Gatherings with Friends and Family:   . Attends Religious Services:   . Active Member of Clubs or Organizations:   . Attends Archivist Meetings:   Marland Kitchen Marital Status:   Intimate Partner Violence:   . Fear of Current or Ex-Partner:   . Emotionally Abused:   Marland Kitchen Physically Abused:   . Sexually Abused:    Health Maintenance  Topic Date Due  . COVID-19 Vaccine (1) Never done  . INFLUENZA VACCINE  06/22/2020  . PAP SMEAR-Modifier  04/12/2021  . TETANUS/TDAP  02/15/2027  . HIV Screening  Completed       Review of Systems Pertinent items noted in HPI and remainder of comprehensive ROS otherwise negative.   Objective:  Blood pressure 138/84, pulse 61, weight 191 lb (86.6 kg), last menstrual period 12/27/2019.     GENERAL: Well-developed, well-nourished female in no acute distress.  HEENT: Normocephalic, atraumatic. Sclerae anicteric.  NECK: Supple. Normal thyroid.  LUNGS: Clear to auscultation bilaterally.  HEART: Regular rate and rhythm. BREASTS: Declined ABDOMEN: Soft, nontender, nondistended. No organomegaly. PELVIC: Normal external female genitalia. Vagina is  pink and rugated.  Normal discharge. Normal appearing cervix. Uterus is normal in size. No adnexal mass or tenderness. EXTREMITIES: No cyanosis, clubbing, or edema, 2+ distal pulses.    Assessment:    Healthy female exam.      Plan:    Pap smear collected Refill on Nuvaring provided See After Visit Summary for Counseling Recommendations

## 2020-03-17 LAB — CYTOLOGY - PAP
Comment: NEGATIVE
Diagnosis: NEGATIVE
High risk HPV: POSITIVE — AB

## 2021-01-06 ENCOUNTER — Other Ambulatory Visit: Payer: Self-pay

## 2021-01-06 DIAGNOSIS — Z3044 Encounter for surveillance of vaginal ring hormonal contraceptive device: Secondary | ICD-10-CM

## 2021-01-06 MED ORDER — ETONOGESTREL-ETHINYL ESTRADIOL 0.12-0.015 MG/24HR VA RING: VAGINAL_RING | VAGINAL | 0 refills | Status: DC

## 2021-01-06 NOTE — Progress Notes (Signed)
Refill Sent as requested per pt Annual Due 02/2021.

## 2021-03-30 ENCOUNTER — Other Ambulatory Visit: Payer: Self-pay

## 2021-03-30 DIAGNOSIS — Z3044 Encounter for surveillance of vaginal ring hormonal contraceptive device: Secondary | ICD-10-CM

## 2021-03-30 MED ORDER — ETONOGESTREL-ETHINYL ESTRADIOL 0.12-0.015 MG/24HR VA RING: VAGINAL_RING | VAGINAL | 0 refills | Status: DC

## 2021-03-30 NOTE — Telephone Encounter (Signed)
Refill on nuva ring

## 2021-06-22 ENCOUNTER — Other Ambulatory Visit: Payer: Self-pay

## 2021-06-22 DIAGNOSIS — Z3044 Encounter for surveillance of vaginal ring hormonal contraceptive device: Secondary | ICD-10-CM

## 2021-06-24 MED ORDER — ETONOGESTREL-ETHINYL ESTRADIOL 0.12-0.015 MG/24HR VA RING: VAGINAL_RING | VAGINAL | 0 refills | Status: DC

## 2021-09-23 ENCOUNTER — Other Ambulatory Visit: Payer: Self-pay

## 2021-09-23 DIAGNOSIS — Z3044 Encounter for surveillance of vaginal ring hormonal contraceptive device: Secondary | ICD-10-CM

## 2021-09-23 MED ORDER — ETONOGESTREL-ETHINYL ESTRADIOL 0.12-0.015 MG/24HR VA RING: VAGINAL_RING | VAGINAL | 0 refills | Status: DC

## 2021-11-05 ENCOUNTER — Encounter: Payer: Self-pay | Admitting: Obstetrics and Gynecology

## 2021-11-05 ENCOUNTER — Other Ambulatory Visit (HOSPITAL_COMMUNITY)
Admission: RE | Admit: 2021-11-05 | Discharge: 2021-11-05 | Disposition: A | Discharge status: Home / Self Care | Payer: No Typology Code available for payment source | Source: Ambulatory Visit | Attending: Obstetrics and Gynecology | Admitting: Obstetrics and Gynecology

## 2021-11-05 ENCOUNTER — Ambulatory Visit (INDEPENDENT_AMBULATORY_CARE_PROVIDER_SITE_OTHER): Payer: No Typology Code available for payment source | Admitting: Obstetrics and Gynecology

## 2021-11-05 ENCOUNTER — Other Ambulatory Visit: Payer: Self-pay

## 2021-11-05 VITALS — BP 135/84 | HR 64 | Ht 60.0 in | Wt 198.0 lb

## 2021-11-05 DIAGNOSIS — Z01419 Encounter for gynecological examination (general) (routine) without abnormal findings: Secondary | ICD-10-CM

## 2021-11-05 DIAGNOSIS — Z6838 Body mass index (BMI) 38.0-38.9, adult: Secondary | ICD-10-CM | POA: Diagnosis not present

## 2021-11-05 DIAGNOSIS — E669 Obesity, unspecified: Secondary | ICD-10-CM

## 2021-11-05 NOTE — Progress Notes (Signed)
Subjective:     Amy Miles is a 35 y.o. female P1 with BMI 38 and LMP 10/22/2021 who is here for a comprehensive physical exam. The patient reports no problems. Patient is sexually active using Nuvaring for contraception. She reports a regular cycle. She denies pelvic pain or abnormal discharge. She denies any urinary symptoms or incontinence. Patient is without any complaints. Patient is interested in weight loss management as he has been trying to lose weight since February.  Past Medical History:  Diagnosis Date   Chicken pox    No past surgical history on file. Family History  Problem Relation Age of Onset   Pancreatic cancer Father    Healthy Mother    Breast cancer Maternal Grandmother    Diabetes Maternal Grandmother    Stroke Maternal Grandmother    Heart disease Paternal Grandmother    Lung cancer Paternal Grandfather    Throat cancer Paternal Grandfather     Social History   Socioeconomic History   Marital status: Single    Spouse name: Not on file   Number of children: 1   Years of education: 14   Highest education level: Not on file  Occupational History   Occupation: Building surveyor  Tobacco Use   Smoking status: Never   Smokeless tobacco: Never  Substance and Sexual Activity   Alcohol use: Yes    Alcohol/week: 0.0 standard drinks    Comment: Social / Office manager   Drug use: No   Sexual activity: Yes    Partners: Male    Birth control/protection: None, Inserts  Other Topics Concern   Not on file  Social History Narrative   Fun: Play outside with her daughter, watch TV   Denies abuse and feels safe at home.    Social Determinants of Health   Financial Resource Strain: Not on file  Food Insecurity: Not on file  Transportation Needs: Not on file  Physical Activity: Not on file  Stress: Not on file  Social Connections: Not on file  Intimate Partner Violence: Not on file   Health Maintenance  Topic Date Due   COVID-19 Vaccine (1) Never done    INFLUENZA VACCINE  Never done   PAP SMEAR-Modifier  03/14/2023   TETANUS/TDAP  02/15/2027   Hepatitis C Screening  Completed   HIV Screening  Completed   Pneumococcal Vaccine 37-17 Years old  Aged Out   HPV VACCINES  Aged Out       Review of Systems Pertinent items noted in HPI and remainder of comprehensive ROS otherwise negative.   Objective:  Blood pressure 135/84, pulse 64, height 5' (1.524 m), weight 198 lb (89.8 kg), last menstrual period 10/22/2021.   GENERAL: Well-developed, well-nourished female in no acute distress.  HEENT: Normocephalic, atraumatic. Sclerae anicteric.  NECK: Supple. Normal thyroid.  LUNGS: Clear to auscultation bilaterally.  HEART: Regular rate and rhythm. BREASTS: Symmetric in size. No palpable masses or lymphadenopathy, skin changes, or nipple drainage. ABDOMEN: Soft, nontender, nondistended. No organomegaly. PELVIC: Normal external female genitalia. Vagina is pink and rugated.  Normal discharge. Normal appearing cervix. Uterus is normal in size. No adnexal mass or tenderness. Chaperone present during the pelvic exam EXTREMITIES: No cyanosis, clubbing, or edema, 2+ distal pulses.     Assessment:    Healthy female exam.      Plan:    Pap smear collected  Patient declined STI testing Weight loss management discussed and patient referred to nutritionist Health maintenance labs today Patient will be contacted with  abnormal results See After Visit Summary for Counseling Recommendations

## 2021-11-05 NOTE — Progress Notes (Signed)
Annual GYN Wants to discuss weight loss medication. Has been exercising and has changed diet without success. Needs RX Nuvaring. Family history of breast cancer.

## 2021-11-06 LAB — COMPREHENSIVE METABOLIC PANEL
ALT: 7 IU/L (ref 0–32)
AST: 10 IU/L (ref 0–40)
Albumin/Globulin Ratio: 1.4 (ref 1.2–2.2)
Albumin: 3.9 g/dL (ref 3.8–4.8)
Alkaline Phosphatase: 68 IU/L (ref 44–121)
BUN/Creatinine Ratio: 8 — ABNORMAL LOW (ref 9–23)
BUN: 8 mg/dL (ref 6–20)
Bilirubin Total: 0.6 mg/dL (ref 0.0–1.2)
CO2: 20 mmol/L (ref 20–29)
Calcium: 8.6 mg/dL — ABNORMAL LOW (ref 8.7–10.2)
Chloride: 104 mmol/L (ref 96–106)
Creatinine, Ser: 1.01 mg/dL — ABNORMAL HIGH (ref 0.57–1.00)
Globulin, Total: 2.7 g/dL (ref 1.5–4.5)
Glucose: 84 mg/dL (ref 70–99)
Potassium: 4.3 mmol/L (ref 3.5–5.2)
Sodium: 137 mmol/L (ref 134–144)
Total Protein: 6.6 g/dL (ref 6.0–8.5)
eGFR: 74 mL/min/{1.73_m2} (ref >59)

## 2021-11-06 LAB — CBC
Hematocrit: 35.9 % (ref 34.0–46.6)
Hemoglobin: 11.7 g/dL (ref 11.1–15.9)
MCH: 25.9 pg — ABNORMAL LOW (ref 26.6–33.0)
MCHC: 32.6 g/dL (ref 31.5–35.7)
MCV: 80 fL (ref 79–97)
Platelets: 324 10*3/uL (ref 150–450)
RBC: 4.51 x10E6/uL (ref 3.77–5.28)
RDW: 12.2 % (ref 11.7–15.4)
WBC: 4.8 10*3/uL (ref 3.4–10.8)

## 2021-11-06 LAB — TSH: TSH: 1.2 u[IU]/mL (ref 0.450–4.500)

## 2021-11-06 LAB — LIPID PANEL
Chol/HDL Ratio: 2.1 ratio (ref 0.0–4.4)
Cholesterol, Total: 140 mg/dL (ref 100–199)
HDL: 66 mg/dL (ref >39)
LDL Chol Calc (NIH): 61 mg/dL (ref 0–99)
Triglycerides: 61 mg/dL (ref 0–149)
VLDL Cholesterol Cal: 13 mg/dL (ref 5–40)

## 2021-11-06 LAB — HEMOGLOBIN A1C
Est. average glucose Bld gHb Est-mCnc: 114 mg/dL
Hgb A1c MFr Bld: 5.6 % (ref 4.8–5.6)

## 2021-11-10 LAB — CYTOLOGY - PAP
Comment: NEGATIVE
Comment: NEGATIVE
Diagnosis: UNDETERMINED — AB
HPV 16: NEGATIVE
HPV 18 / 45: NEGATIVE
High risk HPV: POSITIVE — AB

## 2021-12-22 ENCOUNTER — Other Ambulatory Visit: Payer: Self-pay | Admitting: Obstetrics

## 2021-12-22 DIAGNOSIS — Z3044 Encounter for surveillance of vaginal ring hormonal contraceptive device: Secondary | ICD-10-CM

## 2021-12-23 ENCOUNTER — Other Ambulatory Visit: Payer: Self-pay

## 2021-12-23 ENCOUNTER — Other Ambulatory Visit: Payer: Self-pay | Admitting: Obstetrics

## 2021-12-23 ENCOUNTER — Ambulatory Visit: Payer: No Typology Code available for payment source | Admitting: Family Medicine

## 2021-12-23 DIAGNOSIS — Z3044 Encounter for surveillance of vaginal ring hormonal contraceptive device: Secondary | ICD-10-CM

## 2021-12-23 MED ORDER — ETONOGESTREL-ETHINYL ESTRADIOL 0.12-0.015 MG/24HR VA RING: 1.0000 | VAGINAL_RING | VAGINAL | 11 refills | Status: DC

## 2022-01-11 ENCOUNTER — Ambulatory Visit: Payer: No Typology Code available for payment source | Admitting: Registered"

## 2022-01-11 ENCOUNTER — Other Ambulatory Visit (HOSPITAL_COMMUNITY)
Admission: RE | Admit: 2022-01-11 | Discharge: 2022-01-11 | Disposition: A | Discharge status: Home / Self Care | Payer: No Typology Code available for payment source | Source: Ambulatory Visit | Attending: Obstetrics & Gynecology | Admitting: Obstetrics & Gynecology

## 2022-01-11 ENCOUNTER — Ambulatory Visit (INDEPENDENT_AMBULATORY_CARE_PROVIDER_SITE_OTHER): Payer: No Typology Code available for payment source | Admitting: Obstetrics & Gynecology

## 2022-01-11 ENCOUNTER — Other Ambulatory Visit: Payer: Self-pay

## 2022-01-11 VITALS — BP 142/85 | HR 77 | Wt 199.0 lb

## 2022-01-11 DIAGNOSIS — R87610 Atypical squamous cells of undetermined significance on cytologic smear of cervix (ASC-US): Secondary | ICD-10-CM | POA: Diagnosis present

## 2022-01-11 DIAGNOSIS — R87810 Cervical high risk human papillomavirus (HPV) DNA test positive: Secondary | ICD-10-CM | POA: Diagnosis not present

## 2022-01-11 DIAGNOSIS — Z01812 Encounter for preprocedural laboratory examination: Secondary | ICD-10-CM

## 2022-01-11 LAB — POCT URINE PREGNANCY: Preg Test, Ur: NEGATIVE

## 2022-01-11 NOTE — Progress Notes (Signed)
Patient ID: Amy Miles, female   DOB: Jun 28, 1986, 36 y.o.   MRN: [id number]  Chief Complaint  Patient presents with   Colposcopy    HPI Amy Miles is a 36 y.o. female.  [id number] Patient's last menstrual period was 12/17/2021.  HPI  Indications: Pap smear on December 2022 showed: ASCUS with POSITIVE high risk HPV. Previous colposcopy: none. Prior cervical treatment: no treatment.  Past Medical History:  Diagnosis Date   Chicken pox     No past surgical history on file.  Family History  Problem Relation Age of Onset   Lung cancer Paternal Grandfather    Throat cancer Paternal Grandfather    Heart disease Paternal Grandmother    Breast cancer Maternal Grandmother    Diabetes Maternal Grandmother    Pancreatic cancer Father    Hypertension Mother     Social History Social History   Tobacco Use   Smoking status: Never   Smokeless tobacco: Never  Substance Use Topics   Alcohol use: Yes    Alcohol/week: 0.0 standard drinks    Comment: Social / Office manager   Drug use: No    No Known Allergies  Current Outpatient Medications  Medication Sig Dispense Refill   cetirizine (ZYRTEC) 5 MG tablet Take 5 mg by mouth daily.     etonogestrel-ethinyl estradiol (ELURYNG) 0.12-0.015 MG/24HR vaginal ring Place 1 each vaginally every 28 (twenty-eight) days. Insert vaginally and leave in place for 3 consecutive weeks, then remove for 1 week. 3 each 11   montelukast (SINGULAIR) 10 MG tablet Take 10 mg by mouth daily.     pantoprazole (PROTONIX) 40 MG tablet Take 1 tablet (40 mg total) by mouth daily. 14 tablet 0   terconazole (TERAZOL 3) 0.8 % vaginal cream Place 1 applicator vaginally at bedtime. 20 g 0   triamcinolone cream (KENALOG) 0.1 % Apply 1 application topically 2 (two) times daily. (Patient not taking: Reported on 04/12/2018) 30 g 0   No current facility-administered medications for this visit.    Review of Systems Review of Systems  Constitutional: Negative.    Genitourinary: Negative.    Blood pressure (!) 142/85, pulse 77, weight 199 lb (90.3 kg), last menstrual period 12/17/2021.  Physical Exam Physical Exam Vitals and nursing note reviewed. Exam conducted with a chaperone present.  Constitutional:      Appearance: Normal appearance.  Pulmonary:     Effort: Pulmonary effort is normal.  Genitourinary:    General: Normal vulva.     Exam position: Lithotomy position.     Vagina: Normal.     Cervix: Lesion (endocervical polyp benign) and eversion (ectropion) present. No discharge.        Comments: Ectropion, open glands mild AWE, polyp Neurological:     Mental Status: She is alert.  Psychiatric:        Behavior: Behavior normal.    Data Reviewed Pap results  Assessment    Procedure Details ECC and Bx 12 and 6  Speculum placed in vagina and excellent visualization of cervix achieved, cervix swabbed x 3 with acetic acid solution.  Specimens: ECC and Bx 12 and 6  Complications: none. Patient given informed consent, signed copy in the chart, time out was performed.  Placed in lithotomy position. Cervix viewed with speculum and colposcope after application of acetic acid.   Colposcopy adequate?  yes Acetowhite lesions?yes Punctation?no Mosaicism?  no Abnormal vasculature?  no Biopsies?yes ECC?yes  COMMENTS: Patient was given post procedure instructions.  She will return in 2  weeks for results.  Scheryl Darter, MD    Plan    Specimens labelled and sent to Pathology.       Scheryl Darter 01/11/2022, 11:07 AM

## 2022-01-11 NOTE — Progress Notes (Signed)
Pt presents for Colpo today. Last pap 10/2021- ASCUS,+HRHPV

## 2022-01-13 LAB — SURGICAL PATHOLOGY

## 2022-01-25 ENCOUNTER — Telehealth: Payer: Self-pay

## 2022-01-25 NOTE — Telephone Encounter (Signed)
S/w patient and advised of results and to repeat pap in 1 year ?

## 2023-01-03 ENCOUNTER — Other Ambulatory Visit (HOSPITAL_COMMUNITY)
Admission: RE | Admit: 2023-01-03 | Discharge: 2023-01-03 | Disposition: A | Discharge status: Home / Self Care | Payer: No Typology Code available for payment source | Source: Ambulatory Visit | Attending: Obstetrics and Gynecology | Admitting: Obstetrics and Gynecology

## 2023-01-03 ENCOUNTER — Encounter: Payer: Self-pay | Admitting: Obstetrics and Gynecology

## 2023-01-03 ENCOUNTER — Ambulatory Visit (INDEPENDENT_AMBULATORY_CARE_PROVIDER_SITE_OTHER): Payer: No Typology Code available for payment source | Admitting: Obstetrics and Gynecology

## 2023-01-03 VITALS — BP 128/81 | HR 71 | Ht 60.0 in | Wt 195.0 lb

## 2023-01-03 DIAGNOSIS — Z01419 Encounter for gynecological examination (general) (routine) without abnormal findings: Secondary | ICD-10-CM | POA: Diagnosis not present

## 2023-01-03 DIAGNOSIS — Z3044 Encounter for surveillance of vaginal ring hormonal contraceptive device: Secondary | ICD-10-CM

## 2023-01-03 MED ORDER — ETONOGESTREL-ETHINYL ESTRADIOL 0.12-0.015 MG/24HR VA RING: 1.0000 | VAGINAL_RING | VAGINAL | 11 refills | Status: DC

## 2023-01-03 NOTE — Progress Notes (Signed)
Pt needs refill on Nuva Ring, would like to discuss BTL.

## 2023-01-03 NOTE — Progress Notes (Signed)
Subjective:     Amy Miles is a 37 y.o. female P1 with LMP 12/24/2022 and BMI 38 who is here for a comprehensive physical exam. The patient reports no problems. She reports a monthly period. She is sexually active using Nuvaring for contraception. She is considering a BTL. Patient denies any pelvic pain or abnormal discharge. She denies any urinary symptoms or constipation  Past Medical History:  Diagnosis Date   Chicken pox    History reviewed. No pertinent surgical history. Family History  Problem Relation Age of Onset   Lung cancer Paternal Grandfather    Throat cancer Paternal Grandfather    Heart disease Paternal Grandmother    Breast cancer Maternal Grandmother    Diabetes Maternal Grandmother    Pancreatic cancer Father    Hypertension Mother     Social History   Socioeconomic History   Marital status: Single    Spouse name: Not on file   Number of children: 1   Years of education: 14   Highest education level: Not on file  Occupational History   Occupation: Chemical engineer  Tobacco Use   Smoking status: Never   Smokeless tobacco: Never  Vaping Use   Vaping Use: Never used  Substance and Sexual Activity   Alcohol use: Yes    Alcohol/week: 0.0 standard drinks of alcohol    Comment: Social / Physicist, medical   Drug use: No   Sexual activity: Yes    Partners: Male    Birth control/protection: Inserts  Other Topics Concern   Not on file  Social History Narrative   Fun: Play outside with her daughter, watch TV   Denies abuse and feels safe at home.    Social Determinants of Health   Financial Resource Strain: Not on file  Food Insecurity: Not on file  Transportation Needs: Not on file  Physical Activity: Not on file  Stress: Not on file  Social Connections: Not on file  Intimate Partner Violence: Not on file   Health Maintenance  Topic Date Due   COVID-19 Vaccine (1) Never done   INFLUENZA VACCINE  Never done   PAP SMEAR-Modifier  11/05/2024    DTaP/Tdap/Td (2 - Td or Tdap) 02/15/2027   HPV VACCINES  Completed   Hepatitis C Screening  Completed   HIV Screening  Completed       Review of Systems Pertinent items noted in HPI and remainder of comprehensive ROS otherwise negative.   Objective:  Blood pressure 128/81, pulse 71, height 5' (1.524 m), weight 195 lb (88.5 kg), last menstrual period 12/24/2022.   GENERAL: Well-developed, well-nourished female in no acute distress.  HEENT: Normocephalic, atraumatic. Sclerae anicteric.  NECK: Supple. Normal thyroid.  LUNGS: Clear to auscultation bilaterally.  HEART: Regular rate and rhythm. BREASTS: Symmetric in size. No palpable masses or lymphadenopathy, skin changes, or nipple drainage. ABDOMEN: Soft, nontender, nondistended. No organomegaly. PELVIC: Normal external female genitalia. Vagina is pink and rugated.  Normal discharge. Normal appearing cervix. Uterus is normal in size. No adnexal mass or tenderness. Chaperone present during the pelvic exam EXTREMITIES: No cyanosis, clubbing, or edema, 2+ distal pulses.     Assessment:    Healthy female exam.      Plan:    Pap smear collected Refill on Nuvaring provided Patient declined STI testing Patient will be contacted with abnormal results Patient reports health maintenance labs normal last year. She is looking for a new PCP- referral placed Patient will contact office if interested in [place]- counseling on  risks/benefits of the procedure was provided See After Visit Summary for Counseling Recommendations

## 2023-01-11 LAB — CYTOLOGY - PAP
Comment: NEGATIVE
Diagnosis: NEGATIVE
High risk HPV: NEGATIVE

## 2023-03-11 ENCOUNTER — Ambulatory Visit (INDEPENDENT_AMBULATORY_CARE_PROVIDER_SITE_OTHER): Payer: No Typology Code available for payment source | Admitting: Family Medicine

## 2023-03-11 ENCOUNTER — Encounter: Payer: Self-pay | Admitting: Family Medicine

## 2023-03-11 VITALS — BP 128/74 | HR 64 | Temp 97.8°F | Ht 60.0 in | Wt 200.0 lb

## 2023-03-11 DIAGNOSIS — M25561 Pain in right knee: Secondary | ICD-10-CM

## 2023-03-11 DIAGNOSIS — J4520 Mild intermittent asthma, uncomplicated: Secondary | ICD-10-CM | POA: Diagnosis not present

## 2023-03-11 DIAGNOSIS — J3089 Other allergic rhinitis: Secondary | ICD-10-CM | POA: Diagnosis not present

## 2023-03-11 DIAGNOSIS — E669 Obesity, unspecified: Secondary | ICD-10-CM

## 2023-03-11 MED ORDER — ALBUTEROL SULFATE HFA 108 (90 BASE) MCG/ACT IN AERS: 2.0000 | INHALATION_SPRAY | Freq: Four times a day (QID) | RESPIRATORY_TRACT | 0 refills | Status: DC | PRN

## 2023-03-11 MED ORDER — MONTELUKAST SODIUM 10 MG PO TABS: 10.0000 mg | ORAL_TABLET | Freq: Every day | ORAL | 3 refills | Status: DC

## 2023-03-11 NOTE — Progress Notes (Signed)
New Patient Office Visit  Subjective    Patient ID: Amy Miles, female    DOB: [birth year]  Age: 37 y.o. MRN: [id number]  CC:  Chief Complaint  Patient presents with   Establish Care    Would like to discuss weight loss, has been trying to work on it for last 2 years    HPI Jordan Drozdowski presents to establish care Previous PCP- [facility]  Ob/GYN- Peggy Constant   Allergies and asthma- since childhood.  Exercise induced as child.   Needs Singulair refilled, she was doing much better when she was taking it. Currently taking Zyrtec.   Concerned about weight loss.  Weight gain- 50 lbs over the past several years.   She has tried strength training and 2 days of cardio, done calorie counting and special diets in past.  She has taken Phentermine in the past,   Right knee has a piece of tissue that moves around at times. Denies locking, popping or giving away of knee. No leg swelling or numbness, tingling. No injury.     [employer]- works from home  Used to be an EMT    Outpatient Encounter Medications as of 03/11/2023  Medication Sig   albuterol (VENTOLIN HFA) 108 (90 Base) MCG/ACT inhaler Inhale 2 puffs into the lungs every 6 (six) hours as needed for wheezing or shortness of breath.   cetirizine (ZYRTEC) 5 MG tablet Take 5 mg by mouth daily.   etonogestrel-ethinyl estradiol (ELURYNG) 0.12-0.015 MG/24HR vaginal ring Place 1 each vaginally every 28 (twenty-eight) days. Insert vaginally and leave in place for 3 consecutive weeks, then remove for 1 week.   montelukast (SINGULAIR) 10 MG tablet Take 1 tablet (10 mg total) by mouth at bedtime.   [DISCONTINUED] triamcinolone cream (KENALOG) 0.1 % Apply 1 application topically 2 (two) times daily.   [DISCONTINUED] montelukast (SINGULAIR) 10 MG tablet Take 10 mg by mouth daily.   No facility-administered encounter medications on file as of 03/11/2023.    Past Medical History:  Diagnosis Date   Allergy    Asthma     Chicken pox     History reviewed. No pertinent surgical history.  Family History  Problem Relation Age of Onset   Lung cancer Paternal Grandfather    Throat cancer Paternal Grandfather    Cancer Paternal Grandfather    Heart disease Paternal Grandmother    Breast cancer Maternal Grandmother    Diabetes Maternal Grandmother    Cancer Maternal Grandmother    Pancreatic cancer Father    Cancer Father    Hypertension Mother    COPD Mother     Social History   Socioeconomic History   Marital status: Single    Spouse name: Not on file   Number of children: 1   Years of education: 14   Highest education level: Not on file  Occupational History   Occupation: Building surveyor  Tobacco Use   Smoking status: Never   Smokeless tobacco: Never  Vaping Use   Vaping Use: Never used  Substance and Sexual Activity   Alcohol use: Yes    Comment: Social / Office manager   Drug use: No   Sexual activity: Yes    Partners: Male    Birth control/protection: Inserts  Other Topics Concern   Not on file  Social History Narrative   Fun: Play outside with her daughter, watch TV   Denies abuse and feels safe at home.    Social Determinants of Health  Financial Resource Strain: Not on file  Food Insecurity: Not on file  Transportation Needs: Not on file  Physical Activity: Not on file  Stress: Not on file  Social Connections: Not on file  Intimate Partner Violence: Not on file    Review of Systems  Constitutional:  Negative for chills and fever.  HENT:  Positive for congestion.   Respiratory:  Negative for shortness of breath.   Cardiovascular:  Negative for chest pain, palpitations and leg swelling.  Gastrointestinal:  Negative for abdominal pain, constipation, diarrhea, nausea and vomiting.  Genitourinary:  Negative for dysuria, frequency and urgency.  Neurological:  Negative for dizziness, weakness and headaches.  Psychiatric/Behavioral:  Negative for depression. The patient is  not nervous/anxious.         Objective    BP 128/74 (BP Location: Left Arm, Patient Position: Sitting, Cuff Size: Large)   Pulse 64   Temp 97.8 F (36.6 C) (Temporal)   Ht 5' (1.524 m)   Wt 200 lb (90.7 kg)   SpO2 100%   BMI 39.06 kg/m   Physical Exam Constitutional:      General: She is not in acute distress.    Appearance: She is not ill-appearing.  Eyes:     Extraocular Movements: Extraocular movements intact.     Conjunctiva/sclera: Conjunctivae normal.  Cardiovascular:     Rate and Rhythm: Normal rate.  Pulmonary:     Effort: Pulmonary effort is normal.  Musculoskeletal:     Cervical back: Normal range of motion and neck supple.  Skin:    General: Skin is warm and dry.  Neurological:     General: No focal deficit present.     Mental Status: She is alert and oriented to person, place, and time.  Psychiatric:        Mood and Affect: Mood normal.        Behavior: Behavior normal.        Thought Content: Thought content normal.         Assessment & Plan:   Problem List Items Addressed This Visit       Respiratory   Asthma - Primary   Relevant Medications   albuterol (VENTOLIN HFA) 108 (90 Base) MCG/ACT inhaler   montelukast (SINGULAIR) 10 MG tablet   Other Visit Diagnoses     Environmental and seasonal allergies       Relevant Medications   montelukast (SINGULAIR) 10 MG tablet   Obesity (BMI 30-39.9)       Acute pain of right knee          She is a pleasant 37 year old female who is new to the practice and here to establish care. Restart Singulair for allergies and asthma.  Recommend switching up from Zyrtec to Claritin or Xyzal since she has been on it for years.  Refilled albuterol inhaler.  No recent asthma flares. She will let me know if she would like to see an orthopedist for right knee but currently she is not having any significant pain and no locking, popping or giving away. Discussed weight loss.  Mediterranean diet and DASH diet  handout provided. We discussed the possibility of weekly GLP-1 for weight loss and she declines for now.  Continue with healthy diet and exercise. Follow-up for fasting CPE.   Return for fasting CPE at their convenience.   Hetty Blend, NP-C

## 2023-03-11 NOTE — Patient Instructions (Signed)
Mediterranean Diet ?A Mediterranean diet refers to food and lifestyle choices that are based on the traditions of countries located on the [place] Sea. It focuses on eating more fruits, vegetables, whole grains, beans, nuts, seeds, and heart-healthy fats, and eating less dairy, meat, eggs, and processed foods with added sugar, salt, and fat. This way of eating has been shown to help prevent certain conditions and improve outcomes for people who have chronic diseases, like kidney disease and heart disease. ?What are tips for following this plan? ?Reading food labels ?Check the serving size of packaged foods. For foods such as rice and pasta, the serving size refers to the amount of cooked product, not dry. ?Check the total fat in packaged foods. Avoid foods that have saturated fat or trans fats. ?Check the ingredient list for added sugars, such as corn syrup. ?Shopping ? ?Buy a variety of foods that offer a balanced diet, including: ?Fresh fruits and vegetables (produce). ?Grains, beans, nuts, and seeds. Some of these may be available in unpackaged forms or large amounts (in bulk). ?Fresh seafood. ?Poultry and eggs. ?Low-fat dairy products. ?Buy whole ingredients instead of prepackaged foods. ?Buy fresh fruits and vegetables in-season from local farmers markets. ?Buy plain frozen fruits and vegetables. ?If you do not have access to quality fresh seafood, buy precooked frozen shrimp or canned fish, such as tuna, salmon, or sardines. ?Stock your pantry so you always have certain foods on hand, such as olive oil, canned tuna, canned tomatoes, rice, pasta, and beans. ?Cooking ?Cook foods with extra-virgin olive oil instead of using butter or other vegetable oils. ?Have meat as a side dish, and have vegetables or grains as your main dish. This means having meat in small portions or adding small amounts of meat to foods like pasta or stew. ?Use beans or vegetables instead of meat in common dishes like chili or  lasagna. ?Experiment with different cooking methods. Try roasting, broiling, steaming, and saut?ing vegetables. ?Add frozen vegetables to soups, stews, pasta, or rice. ?Add nuts or seeds for added healthy fats and plant protein at each meal. You can add these to yogurt, salads, or vegetable dishes. ?Marinate fish or vegetables using olive oil, lemon juice, garlic, and fresh herbs. ?Meal planning ?Plan to eat one vegetarian meal one day each week. Try to work up to two vegetarian meals, if possible. ?Eat seafood two or more times a week. ?Have healthy snacks readily available, such as: ?Vegetable sticks with hummus. ?Greek yogurt. ?Fruit and nut trail mix. ?Eat balanced meals throughout the week. This includes: ?Fruit: 2-3 servings a day. ?Vegetables: 4-5 servings a day. ?Low-fat dairy: 2 servings a day. ?Fish, poultry, or lean meat: 1 serving a day. ?Beans and legumes: 2 or more servings a week. ?Nuts and seeds: 1-2 servings a day. ?Whole grains: 6-8 servings a day. ?Extra-virgin olive oil: 3-4 servings a day. ?Limit red meat and sweets to only a few servings a month. ?Lifestyle ? ?Cook and eat meals together with your family, when possible. ?Drink enough fluid to keep your urine pale yellow. ?Be physically active every day. This includes: ?Aerobic exercise like running or swimming. ?Leisure activities like gardening, walking, or housework. ?Get 7-8 hours of sleep each night. ?If recommended by your health care provider, drink red wine in moderation. This means 1 glass a day for nonpregnant women and 2 glasses a day for men. A glass of wine equals 5 oz (150 mL). ?What foods should I eat? ?Fruits ?Apples. Apricots. Avocado. Berries. Bananas. Cherries. Dates.   Figs. Grapes. Lemons. Melon. Oranges. Peaches. Plums. Pomegranate. ?Vegetables ?Artichokes. Beets. Broccoli. Cabbage. Carrots. Eggplant. Green beans. Chard. Kale. Spinach. Onions. Leeks. Peas. Squash. Tomatoes. Peppers. Radishes. ?Grains ?Whole-grain pasta. Brown  rice. Bulgur wheat. Polenta. Couscous. Whole-wheat bread. Oatmeal. Quinoa. ?Meats and other proteins ?Beans. Almonds. Sunflower seeds. Pine nuts. Peanuts. Cod. Salmon. Scallops. Shrimp. Tuna. Tilapia. Clams. Oysters. Eggs. Poultry without skin. ?Dairy ?Low-fat milk. Cheese. Greek yogurt. ?Fats and oils ?Extra-virgin olive oil. Avocado oil. Grapeseed oil. ?Beverages ?Water. Red wine. Herbal tea. ?Sweets and desserts ?Greek yogurt with honey. Baked apples. Poached pears. Trail mix. ?Seasonings and condiments ?Basil. Cilantro. Coriander. Cumin. Mint. Parsley. Sage. Rosemary. Tarragon. Garlic. Oregano. Thyme. Pepper. Balsamic vinegar. Tahini. Hummus. Tomato sauce. Olives. Mushrooms. ?The items listed above may not be a complete list of foods and beverages you can eat. Contact a dietitian for more information. ?What foods should I limit? ?This is a list of foods that should be eaten rarely or only on special occasions. ?Fruits ?Fruit canned in syrup. ?Vegetables ?Deep-fried potatoes (french fries). ?Grains ?Prepackaged pasta or rice dishes. Prepackaged cereal with added sugar. Prepackaged snacks with added sugar. ?Meats and other proteins ?Beef. Pork. Lamb. Poultry with skin. Hot dogs. Bacon. ?Dairy ?Ice cream. Sour cream. Whole milk. ?Fats and oils ?Butter. Canola oil. Vegetable oil. Beef fat (tallow). Lard. ?Beverages ?Juice. Sugar-sweetened soft drinks. Beer. Liquor and spirits. ?Sweets and desserts ?Cookies. Cakes. Pies. Candy. ?Seasonings and condiments ?Mayonnaise. Pre-made sauces and marinades. ?The items listed above may not be a complete list of foods and beverages you should limit. Contact a dietitian for more information. ?Summary ?The Mediterranean diet includes both food and lifestyle choices. ?Eat a variety of fresh fruits and vegetables, beans, nuts, seeds, and whole grains. ?Limit the amount of red meat and sweets that you eat. ?If recommended by your health care provider, drink red wine in moderation.  This means 1 glass a day for nonpregnant women and 2 glasses a day for men. A glass of wine equals 5 oz (150 mL). ?This information is not intended to replace advice given to you by your health care provider. Make sure you discuss any questions you have with your health care provider. ?Document Revised: 12/14/2019 Document Reviewed: 10/11/2019 ?Elsevier Patient Education ? 2023 [employer] ? ?

## 2023-03-18 ENCOUNTER — Encounter: Payer: Self-pay | Admitting: Family Medicine

## 2023-03-18 ENCOUNTER — Ambulatory Visit (INDEPENDENT_AMBULATORY_CARE_PROVIDER_SITE_OTHER): Payer: No Typology Code available for payment source | Admitting: Family Medicine

## 2023-03-18 VITALS — BP 122/84 | HR 79 | Temp 97.6°F | Ht 60.0 in | Wt 204.0 lb

## 2023-03-18 DIAGNOSIS — E669 Obesity, unspecified: Secondary | ICD-10-CM | POA: Diagnosis not present

## 2023-03-18 DIAGNOSIS — Z833 Family history of diabetes mellitus: Secondary | ICD-10-CM | POA: Diagnosis not present

## 2023-03-18 DIAGNOSIS — Z0001 Encounter for general adult medical examination with abnormal findings: Secondary | ICD-10-CM | POA: Diagnosis not present

## 2023-03-18 LAB — LIPID PANEL
Cholesterol: 151 mg/dL (ref 0–200)
HDL: 62.5 mg/dL (ref >39.00)
LDL Cholesterol: 76 mg/dL (ref 0–99)
NonHDL: 88.39
Total CHOL/HDL Ratio: 2
Triglycerides: 63 mg/dL (ref 0.0–149.0)
VLDL: 12.6 mg/dL (ref 0.0–40.0)

## 2023-03-18 LAB — CBC WITH DIFFERENTIAL/PLATELET
Basophils Absolute: 0 10*3/uL (ref 0.0–0.1)
Basophils Relative: 0.8 % (ref 0.0–3.0)
Eosinophils Absolute: 0.5 10*3/uL (ref 0.0–0.7)
Eosinophils Relative: 9.4 % — ABNORMAL HIGH (ref 0.0–5.0)
HCT: 36.3 % (ref 36.0–46.0)
Hemoglobin: 12.1 g/dL (ref 12.0–15.0)
Lymphocytes Relative: 42.2 % (ref 12.0–46.0)
Lymphs Abs: 2.1 10*3/uL (ref 0.7–4.0)
MCHC: 33.3 g/dL (ref 30.0–36.0)
MCV: 81.1 fl (ref 78.0–100.0)
Monocytes Absolute: 0.4 10*3/uL (ref 0.1–1.0)
Monocytes Relative: 7.6 % (ref 3.0–12.0)
Neutro Abs: 2 10*3/uL (ref 1.4–7.7)
Neutrophils Relative %: 40 % — ABNORMAL LOW (ref 43.0–77.0)
Platelets: 345 10*3/uL (ref 150.0–400.0)
RBC: 4.48 Mil/uL (ref 3.87–5.11)
RDW: 13.6 % (ref 11.5–15.5)
WBC: 5.1 10*3/uL (ref 4.0–10.5)

## 2023-03-18 LAB — URINALYSIS, ROUTINE W REFLEX MICROSCOPIC
Bilirubin Urine: NEGATIVE
Ketones, ur: NEGATIVE
Nitrite: NEGATIVE
Specific Gravity, Urine: 1.02 (ref 1.000–1.030)
Total Protein, Urine: NEGATIVE
Urine Glucose: NEGATIVE
Urobilinogen, UA: 0.2 (ref 0.0–1.0)
pH: 5.5 (ref 5.0–8.0)

## 2023-03-18 LAB — COMPREHENSIVE METABOLIC PANEL
ALT: 7 U/L (ref 0–35)
AST: 10 U/L (ref 0–37)
Albumin: 3.7 g/dL (ref 3.5–5.2)
Alkaline Phosphatase: 55 U/L (ref 39–117)
BUN: 8 mg/dL (ref 6–23)
CO2: 23 mEq/L (ref 19–32)
Calcium: 8.6 mg/dL (ref 8.4–10.5)
Chloride: 107 mEq/L (ref 96–112)
Creatinine, Ser: 0.92 mg/dL (ref 0.40–1.20)
GFR: 78.82 mL/min (ref >60.00)
Glucose, Bld: 85 mg/dL (ref 70–99)
Potassium: 4.1 mEq/L (ref 3.5–5.1)
Sodium: 138 mEq/L (ref 135–145)
Total Bilirubin: 0.6 mg/dL (ref 0.2–1.2)
Total Protein: 6.7 g/dL (ref 6.0–8.3)

## 2023-03-18 LAB — HEMOGLOBIN A1C: Hgb A1c MFr Bld: 5.6 % (ref 4.6–6.5)

## 2023-03-18 LAB — TSH: TSH: 2.09 u[IU]/mL (ref 0.35–5.50)

## 2023-03-18 NOTE — Progress Notes (Signed)
Complete physical exam  Patient: Amy Miles   DOB: 01/03/1986   37 y.o. Female  MRN: [id number]  Subjective:    Chief Complaint  Patient presents with   Annual Exam    fasting   She is fairly new to the practice and here for a complete physical exam.  Works for [employer]- from home   Health Maintenance  Topic Date Due   COVID-19 Vaccine (1) 03/27/2023*   Flu Shot  06/23/2023   Pap Smear  01/03/2026   DTaP/Tdap/Td vaccine (2 - Td or Tdap) 02/15/2027   HPV Vaccine  Completed   Hepatitis C Screening: USPSTF Recommendation to screen - Ages 71-79 yo.  Completed   HIV Screening  Completed  *Topic was postponed. The date shown is not the original due date.    Wears seatbelt always, uses sunscreen, smoke detectors in home and functioning, does not text while driving, feels safe in home environment.  Depression screening:    03/11/2023    1:14 PM 11/05/2021   10:23 AM 02/14/2017    2:03 PM  Depression screen PHQ 2/9  Decreased Interest 0 0 0  Down, Depressed, Hopeless 0 0 0  PHQ - 2 Score 0 0 0  Altered sleeping  0   Tired, decreased energy  0   Change in appetite  0   Feeling bad or failure about yourself   0   Trouble concentrating  0   Moving slowly or fidgety/restless  0   Suicidal thoughts  0   PHQ-9 Score  0    Anxiety Screening:    11/05/2021   10:24 AM  GAD 7 : Generalized Anxiety Score  Nervous, Anxious, on Edge 0  Control/stop worrying 0  Worry too much - different things 0  Trouble relaxing 0  Restless 0  Easily annoyed or irritable 0  Afraid - awful might happen 0  Total GAD 7 Score 0    Vision:Within last year and Dental: No current dental problems and Receives regular dental care  Patient Active Problem List   Diagnosis Date Noted   Cervical low risk human papillomavirus (HPV) DNA test positive 04/25/2018   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 No pertinent surgical history. Social History   Tobacco Use   Smoking status: Never   Smokeless tobacco: Never  Vaping Use   Vaping Use: Never used  Substance Use Topics   Alcohol use: Yes    Comment: Social / Occassional   Drug use: No      Patient Care Team: Avanell Shackleton, NP-C as PCP - General (Family Medicine)   Outpatient Medications Prior to Visit  Medication Sig   albuterol (VENTOLIN HFA) 108 (90 Base) MCG/ACT inhaler Inhale 2 puffs into the lungs every 6 (six) hours as needed for wheezing or shortness of breath.   cetirizine (ZYRTEC) 5 MG tablet Take 5 mg by mouth daily.   etonogestrel-ethinyl estradiol (ELURYNG) 0.12-0.015 MG/24HR vaginal ring Place 1 each vaginally every 28 (twenty-eight) days. Insert vaginally and leave in place for 3 consecutive weeks, then remove for 1 week.   montelukast (SINGULAIR) 10 MG tablet Take 1 tablet (10 mg total) by mouth at bedtime.   No facility-administered medications prior to visit.    Review of Systems  Constitutional:  Negative for chills, fever, malaise/fatigue and weight loss.  HENT:  Positive for congestion. Negative for ear pain, sinus pain and sore throat.   Eyes:  Negative for blurred vision, double vision and pain.  Respiratory:  Negative for cough, shortness of breath and wheezing.   Cardiovascular:  Negative for chest pain, palpitations and leg swelling.  Gastrointestinal:  Negative for abdominal pain, constipation, diarrhea, nausea and vomiting.  Genitourinary:  Negative for dysuria, frequency and urgency.  Musculoskeletal:  Negative for back pain, joint pain and myalgias.  Skin:  Negative for rash.  Neurological:  Negative for dizziness, tingling, focal weakness and headaches.  Endo/Heme/Allergies:  Does not bruise/bleed easily.  Psychiatric/Behavioral:  Negative for depression. The patient is not nervous/anxious.        Objective:    BP 122/84  (BP Location: Left Arm, Patient Position: Sitting, Cuff Size: Large)   Pulse 79   Temp 97.6 F (36.4 C) (Temporal)   Ht 5' (1.524 m)   Wt 204 lb (92.5 kg)   SpO2 98%   BMI 39.84 kg/m  BP Readings from Last 3 Encounters:  03/18/23 122/84  03/11/23 128/74  01/03/23 128/81   Wt Readings from Last 3 Encounters:  03/18/23 204 lb (92.5 kg)  03/11/23 200 lb (90.7 kg)  01/03/23 195 lb (88.5 kg)    Physical Exam Constitutional:      General: She is not in acute distress.    Appearance: She is not ill-appearing.  HENT:     Right Ear: Tympanic membrane and external ear normal.     Left Ear: Tympanic membrane, ear canal and external ear normal.     Ears:     Comments: Cerumen in right ear canal    Nose: Nose normal.     Mouth/Throat:     Mouth: Mucous membranes are moist.     Pharynx: Oropharynx is clear.  Eyes:     Extraocular Movements: Extraocular movements intact.     Conjunctiva/sclera: Conjunctivae normal.     Pupils: Pupils are equal, round, and reactive to light.  Neck:     Thyroid: No thyroid mass, thyromegaly or thyroid tenderness.  Cardiovascular:     Rate and Rhythm: Normal rate and regular rhythm.     Pulses: Normal pulses.     Heart sounds: Normal heart sounds.  Pulmonary:     Effort: Pulmonary effort is normal.     Breath sounds: Normal breath sounds.  Abdominal:     General: Bowel sounds are normal.     Palpations: Abdomen is soft.     Tenderness: There is no abdominal tenderness. There is no right CVA tenderness, left CVA tenderness, guarding or rebound.  Musculoskeletal:        General: Normal range of motion.     Cervical back: Normal range of motion and neck supple. No tenderness.     Right lower leg: No edema.     Left lower leg: No edema.  Lymphadenopathy:     Cervical: No cervical adenopathy.  Skin:    General: Skin is warm and dry.     Findings: No lesion or rash.  Neurological:     General: No focal deficit present.     Mental Status: She is  alert and oriented to person, place, and time.     Cranial Nerves: No cranial nerve deficit.     Sensory: No sensory deficit.     Motor: No weakness.     Gait: Gait normal.  Psychiatric:  Mood and Affect: Mood normal.        Behavior: Behavior normal.        Thought Content: Thought content normal.      No results found for any visits on 03/18/23.    Assessment & Plan:    Routine Health Maintenance and Physical Exam  Problem List Items Addressed This Visit   None Visit Diagnoses     Encounter for general adult medical examination with abnormal findings    -  Primary   Relevant Orders   Urinalysis, Routine w reflex microscopic   Obesity (BMI 30-39.9)       Relevant Orders   CBC with Differential/Platelet   Comprehensive metabolic panel   TSH   Lipid panel   Hemoglobin A1c   Family history of diabetes mellitus (DM)       Relevant Orders   Hemoglobin A1c      Preventive health care reviewed.  Counseling on healthy lifestyle including diet and exercise.  Recommend regular dental and eye exams.  Immunizations reviewed.  Discussed safety. UTD with OB/GYN visit.  She will work on diet and exercise and let me know how she is doing.  Check labs to look for complications related to obesity and due to family history.  Follow up in 1 year or sooner if needed.    Return in about 1 year (around 03/17/2024).     Hetty Blend, NP-C

## 2023-07-03 ENCOUNTER — Other Ambulatory Visit: Payer: Self-pay | Admitting: Family Medicine

## 2023-07-03 DIAGNOSIS — J3089 Other allergic rhinitis: Secondary | ICD-10-CM

## 2023-12-28 ENCOUNTER — Other Ambulatory Visit: Payer: Self-pay | Admitting: Family Medicine

## 2023-12-28 DIAGNOSIS — J3089 Other allergic rhinitis: Secondary | ICD-10-CM

## 2024-03-12 ENCOUNTER — Other Ambulatory Visit: Payer: Self-pay

## 2024-03-12 ENCOUNTER — Telehealth: Payer: Self-pay

## 2024-03-12 MED ORDER — ETONOGESTREL-ETHINYL ESTRADIOL 0.12-0.015 MG/24HR VA RING: VAGINAL_RING | VAGINAL | 0 refills | Status: DC

## 2024-03-12 NOTE — Telephone Encounter (Signed)
  Rx for Nuvaring sent to [facility], Pt needs to schedule and keep her Annual appointment.

## 2024-03-20 ENCOUNTER — Encounter: Payer: Self-pay | Admitting: Family Medicine

## 2024-03-20 ENCOUNTER — Ambulatory Visit (INDEPENDENT_AMBULATORY_CARE_PROVIDER_SITE_OTHER): Payer: No Typology Code available for payment source | Admitting: Family Medicine

## 2024-03-20 VITALS — BP 110/74 | HR 75 | Temp 97.6°F | Ht 60.0 in | Wt 202.0 lb

## 2024-03-20 DIAGNOSIS — J4520 Mild intermittent asthma, uncomplicated: Secondary | ICD-10-CM

## 2024-03-20 DIAGNOSIS — Z6839 Body mass index (BMI) 39.0-39.9, adult: Secondary | ICD-10-CM | POA: Diagnosis not present

## 2024-03-20 DIAGNOSIS — Z0001 Encounter for general adult medical examination with abnormal findings: Secondary | ICD-10-CM | POA: Diagnosis not present

## 2024-03-20 DIAGNOSIS — R61 Generalized hyperhidrosis: Secondary | ICD-10-CM

## 2024-03-20 DIAGNOSIS — E669 Obesity, unspecified: Secondary | ICD-10-CM

## 2024-03-20 LAB — T4, FREE: Free T4: 0.83 ng/dL (ref 0.60–1.60)

## 2024-03-20 LAB — CBC WITH DIFFERENTIAL/PLATELET
Basophils Absolute: 0 10*3/uL (ref 0.0–0.1)
Basophils Relative: 0.9 % (ref 0.0–3.0)
Eosinophils Absolute: 0.3 10*3/uL (ref 0.0–0.7)
Eosinophils Relative: 5.9 % — ABNORMAL HIGH (ref 0.0–5.0)
HCT: 35.7 % — ABNORMAL LOW (ref 36.0–46.0)
Hemoglobin: 11.9 g/dL — ABNORMAL LOW (ref 12.0–15.0)
Lymphocytes Relative: 46.1 % — ABNORMAL HIGH (ref 12.0–46.0)
Lymphs Abs: 2.1 10*3/uL (ref 0.7–4.0)
MCHC: 33.3 g/dL (ref 30.0–36.0)
MCV: 81.3 fl (ref 78.0–100.0)
Monocytes Absolute: 0.4 10*3/uL (ref 0.1–1.0)
Monocytes Relative: 9.5 % (ref 3.0–12.0)
Neutro Abs: 1.7 10*3/uL (ref 1.4–7.7)
Neutrophils Relative %: 37.6 % — ABNORMAL LOW (ref 43.0–77.0)
Platelets: 333 10*3/uL (ref 150.0–400.0)
RBC: 4.39 Mil/uL (ref 3.87–5.11)
RDW: 13.9 % (ref 11.5–15.5)
WBC: 4.6 10*3/uL (ref 4.0–10.5)

## 2024-03-20 LAB — LIPID PANEL
Cholesterol: 157 mg/dL (ref 0–200)
HDL: 60.5 mg/dL (ref >39.00)
LDL Cholesterol: 80 mg/dL (ref 0–99)
NonHDL: 96.01
Total CHOL/HDL Ratio: 3
Triglycerides: 82 mg/dL (ref 0.0–149.0)
VLDL: 16.4 mg/dL (ref 0.0–40.0)

## 2024-03-20 LAB — COMPREHENSIVE METABOLIC PANEL WITH GFR
ALT: 16 U/L (ref 0–35)
AST: 35 U/L (ref 0–37)
Albumin: 3.8 g/dL (ref 3.5–5.2)
Alkaline Phosphatase: 52 U/L (ref 39–117)
BUN: 9 mg/dL (ref 6–23)
CO2: 24 meq/L (ref 19–32)
Calcium: 8.9 mg/dL (ref 8.4–10.5)
Chloride: 109 meq/L (ref 96–112)
Creatinine, Ser: 0.95 mg/dL (ref 0.40–1.20)
GFR: 75.31 mL/min (ref >60.00)
Glucose, Bld: 90 mg/dL (ref 70–99)
Potassium: 4 meq/L (ref 3.5–5.1)
Sodium: 139 meq/L (ref 135–145)
Total Bilirubin: 0.4 mg/dL (ref 0.2–1.2)
Total Protein: 6.6 g/dL (ref 6.0–8.3)

## 2024-03-20 LAB — HEMOGLOBIN A1C: Hgb A1c MFr Bld: 5.6 % (ref 4.6–6.5)

## 2024-03-20 LAB — TSH: TSH: 2.6 u[IU]/mL (ref 0.35–5.50)

## 2024-03-20 MED ORDER — ALBUTEROL SULFATE HFA 108 (90 BASE) MCG/ACT IN AERS: 2.0000 | INHALATION_SPRAY | Freq: Four times a day (QID) | RESPIRATORY_TRACT | 1 refills | Status: AC | PRN

## 2024-03-20 NOTE — Progress Notes (Signed)
 Complete physical exam  Patient: Amy Miles   DOB: 03/09/1986   38 y.o. Female  MRN: [id number]  Subjective:    Chief Complaint  Patient presents with   Annual Exam   She is here for a complete physical exam. OB/GYN - appt in May   Eye doctor - new glasses Dentist q18mos  Engaged.  Daughter graduating from [employer]   Exercises - strength training  Mostly healthy diet    Asthma-uses albuterol  inhaler 1-2 times per month.    Health Maintenance  Topic Date Due   Pneumococcal Vaccination (1 of 2 - PCV) Never done   COVID-19 Vaccine (1 - 2024-25 season) 04/05/2024*   Flu Shot  06/22/2024   DTaP/Tdap/Td vaccine (2 - Td or Tdap) 02/15/2027   Pap with HPV screening  01/04/2028   HPV Vaccine  Completed   Hepatitis C Screening  Completed   HIV Screening  Completed   Meningitis B Vaccine  Aged Out  *Topic was postponed. The date shown is not the original due date.    Wears seatbelt always, smoke detectors in home and functioning, does not text while driving, feels safe in home environment.  Depression screening:    03/20/2024    8:43 AM 03/11/2023    1:14 PM 11/05/2021   10:23 AM  Depression screen PHQ 2/9  Decreased Interest 0 0 0  Down, Depressed, Hopeless 0 0 0  PHQ - 2 Score 0 0 0  Altered sleeping   0  Tired, decreased energy   0  Change in appetite   0  Feeling bad or failure about yourself    0  Trouble concentrating   0  Moving slowly or fidgety/restless   0  Suicidal thoughts   0  PHQ-9 Score   0   Anxiety Screening:    11/05/2021   10:24 AM  GAD 7 : Generalized Anxiety Score  Nervous, Anxious, on Edge 0  Control/stop worrying 0  Worry too much - different things 0  Trouble relaxing 0  Restless 0  Easily annoyed or irritable 0  Afraid - awful might happen 0  Total GAD 7 Score 0    Vision:Within last year and Dental: No current dental problems and Receives regular dental care  Patient Active Problem List   Diagnosis Date Noted   Cervical low  risk human papillomavirus (HPV) DNA test positive 04/25/2018   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 No pertinent surgical history. Social History   Tobacco Use   Smoking status: Never   Smokeless tobacco: Never  Vaping Use   Vaping status: Never Used  Substance Use Topics   Alcohol use: Not Currently    Comment: Social / Occassional   Drug use: No      Patient Care Team: Abram Abraham, NP-C as PCP - General (Family Medicine)   Outpatient Medications Prior to Visit  Medication Sig   cetirizine (ZYRTEC) 5 MG tablet Take 5 mg by mouth daily.   etonogestrel -ethinyl estradiol  (ELURYNG) 0.12-0.015 MG/24HR vaginal ring Place 1 each vaginally every 28 (twenty-eight) days. Insert vaginally and leave in place for 3 consecutive weeks, then remove for 1 week.   etonogestrel -ethinyl estradiol  (ELURYNG) 0.12-0.015 MG/24HR vaginal ring Insert vaginally and leave in place for 3 consecutive weeks, then remove  for 1 week.   montelukast  (SINGULAIR ) 10 MG tablet TAKE 1 TABLET BY MOUTH AT BEDTIME   [DISCONTINUED] albuterol  (VENTOLIN  HFA) 108 (90 Base) MCG/ACT inhaler Inhale 2 puffs into the lungs every 6 (six) hours as needed for wheezing or shortness of breath.   No facility-administered medications prior to visit.    Review of Systems  Constitutional:  Negative for chills, fever and malaise/fatigue.  HENT:  Negative for ear pain, sinus pain and sore throat.   Eyes:  Negative for blurred vision, double vision and pain.  Respiratory:  Negative for cough, shortness of breath and wheezing.   Cardiovascular:  Negative for chest pain, palpitations and leg swelling.  Gastrointestinal:  Negative for abdominal pain, constipation, diarrhea, nausea and vomiting.  Genitourinary:  Negative for dysuria, frequency and urgency.  Musculoskeletal:   Negative for back pain, joint pain and myalgias.  Skin:  Negative for rash.  Neurological:  Negative for dizziness, tingling, focal weakness and headaches.  Psychiatric/Behavioral:  Negative for depression. The patient is not nervous/anxious.        Objective:    BP 110/74 (BP Location: Left Arm, Patient Position: Sitting)   Pulse 75   Temp 97.6 F (36.4 C) (Temporal)   Ht 5' (1.524 m)   Wt 202 lb (91.6 kg)   SpO2 99%   BMI 39.45 kg/m  BP Readings from Last 3 Encounters:  03/20/24 110/74  03/18/23 122/84  03/11/23 128/74   Wt Readings from Last 3 Encounters:  03/20/24 202 lb (91.6 kg)  03/18/23 204 lb (92.5 kg)  03/11/23 200 lb (90.7 kg)    Physical Exam Constitutional:      General: She is not in acute distress.    Appearance: She is not ill-appearing.  HENT:     Right Ear: Tympanic membrane, ear canal and external ear normal.     Left Ear: Tympanic membrane, ear canal and external ear normal.     Nose: Nose normal.     Mouth/Throat:     Mouth: Mucous membranes are moist.     Pharynx: Oropharynx is clear.  Eyes:     Extraocular Movements: Extraocular movements intact.     Conjunctiva/sclera: Conjunctivae normal.     Pupils: Pupils are equal, round, and reactive to light.  Neck:     Thyroid: No thyroid mass, thyromegaly or thyroid tenderness.  Cardiovascular:     Rate and Rhythm: Normal rate and regular rhythm.     Pulses: Normal pulses.     Heart sounds: Normal heart sounds.  Pulmonary:     Effort: Pulmonary effort is normal.     Breath sounds: Normal breath sounds.  Abdominal:     General: Bowel sounds are normal.     Palpations: Abdomen is soft.     Tenderness: There is no abdominal tenderness. There is no right CVA tenderness, left CVA tenderness, guarding or rebound.  Musculoskeletal:        General: Normal range of motion.     Cervical back: Normal range of motion and neck supple. No tenderness.     Right lower leg: No edema.     Left lower leg: No  edema.  Lymphadenopathy:     Cervical: No cervical adenopathy.  Skin:    General: Skin is warm and dry.     Findings: No lesion or rash.  Neurological:     General: No focal deficit present.     Mental Status: She is alert and oriented to person, place, and time.  Cranial Nerves: No cranial nerve deficit.     Sensory: No sensory deficit.     Motor: No weakness.     Gait: Gait normal.  Psychiatric:        Mood and Affect: Mood normal.        Behavior: Behavior normal.        Thought Content: Thought content normal.      No results found for any visits on 03/20/24.    Assessment & Plan:    Routine Health Maintenance and Physical Exam  Problem List Items Addressed This Visit     Asthma   Relevant Medications   albuterol  (VENTOLIN  HFA) 108 (90 Base) MCG/ACT inhaler   Other Visit Diagnoses       Encounter for general adult medical examination with abnormal findings    -  Primary     Night sweats       Relevant Orders   TSH   T4, free     Obesity (BMI 30-39.9)       Relevant Orders   CBC with Differential/Platelet   Comprehensive metabolic panel with GFR   Lipid panel   TSH   Hemoglobin A1c   T4, free      She is here for fasting CPE.  Reports having intermittent night sweats and thinks she may be approaching perimenopause.  She has an appointment next month with OB/GYN. Asthma is well-controlled. Congratulated her on upcoming engagement. Preventive health care reviewed.  Counseling on healthy lifestyle including diet and exercise.  Recommend regular dental and eye exams.  Immunizations reviewed.  Discussed safety. Declines pneumonia vaccine     Return in about 1 year (around 03/20/2025).     Alyson Back, NP-C

## 2024-03-27 ENCOUNTER — Other Ambulatory Visit: Payer: Self-pay | Admitting: Family Medicine

## 2024-03-27 DIAGNOSIS — J3089 Other allergic rhinitis: Secondary | ICD-10-CM

## 2024-04-02 ENCOUNTER — Other Ambulatory Visit: Payer: Self-pay

## 2024-04-02 MED ORDER — ETONOGESTREL-ETHINYL ESTRADIOL 0.12-0.015 MG/24HR VA RING: VAGINAL_RING | VAGINAL | 0 refills | Status: DC

## 2024-04-17 ENCOUNTER — Encounter: Payer: Self-pay | Admitting: Obstetrics and Gynecology

## 2024-04-17 ENCOUNTER — Ambulatory Visit: Admitting: Obstetrics and Gynecology

## 2024-04-17 VITALS — BP 121/82 | HR 55 | Ht 60.0 in | Wt 204.0 lb

## 2024-04-17 DIAGNOSIS — Z01419 Encounter for gynecological examination (general) (routine) without abnormal findings: Secondary | ICD-10-CM

## 2024-04-17 MED ORDER — ETONOGESTREL-ETHINYL ESTRADIOL 0.12-0.015 MG/24HR VA RING: VAGINAL_RING | VAGINAL | 11 refills | Status: AC

## 2024-04-17 NOTE — Progress Notes (Signed)
 Subjective:     Amy Miles is a 38 y.o. female P1 with LMP 04/07/24 and BMI 39 who is here for a comprehensive physical exam. The patient reports no problems. She reports a monthly 5-day period. She denies pelvic pain or abnormal discharge. She is sexually active using Nuvaring for contraception. She denies urinary incontinence or issues with bowel movement. Patient is still considering BTL if partner does not get vasectomy at the end of the year  Past Medical History:  Diagnosis Date   Allergy    Asthma    Chicken pox    No past surgical history on file. Family History  Problem Relation Age of Onset   Hypertension Mother    COPD Mother    Pancreatic cancer Father    Cancer Father    Breast cancer Maternal Grandmother    Diabetes Maternal Grandmother    Cancer Maternal Grandmother    Stroke Maternal Grandmother    Heart disease Paternal Grandmother    Lung cancer Paternal Grandfather    Throat cancer Paternal Grandfather    Cancer Paternal Grandfather     Social History   Socioeconomic History   Marital status: Single    Spouse name: Not on file   Number of children: 1   Years of education: 14   Highest education level: Not on file  Occupational History   Occupation: Building surveyor  Tobacco Use   Smoking status: Never   Smokeless tobacco: Never  Vaping Use   Vaping status: Never Used  Substance and Sexual Activity   Alcohol use: Not Currently    Comment: Social / Office manager   Drug use: No   Sexual activity: Yes    Partners: Male    Birth control/protection: Inserts  Other Topics Concern   Not on file  Social History Narrative   Fun: Play outside with her daughter, watch TV   Denies abuse and feels safe at home.    Social Drivers of Corporate investment banker Strain: Not on file  Food Insecurity: Not on file  Transportation Needs: Not on file  Physical Activity: Not on file  Stress: Not on file  Social Connections: Unknown (04/06/2022)   Received  from [facility]   Social Network    Social Network: Not on file  Intimate Partner Violence: Unknown (02/26/2022)   Received from [facility]   HITS    Physically Hurt: Not on file    Insult or Talk Down To: Not on file    Threaten Physical Harm: Not on file    Scream or Curse: Not on file   Health Maintenance  Topic Date Due   Pneumococcal Vaccine 3-58 Years old (1 of 2 - PCV) Never done   COVID-19 Vaccine (1 - 2024-25 season) Never done   INFLUENZA VACCINE  06/22/2024   DTaP/Tdap/Td (2 - Td or Tdap) 02/15/2027   Cervical Cancer Screening (HPV/Pap Cotest)  01/04/2028   HPV VACCINES  Completed   Hepatitis C Screening  Completed   HIV Screening  Completed   Meningococcal B Vaccine  Aged Out       Review of Systems Pertinent items noted in HPI and remainder of comprehensive ROS otherwise negative.   Objective:  Blood pressure 121/82, pulse (!) 55, height 5' (1.524 m), weight 204 lb (92.5 kg), last menstrual period 04/07/2024.   GENERAL: Well-developed, well-nourished female in no acute distress.  HEENT: Normocephalic, atraumatic. Sclerae anicteric.  NECK: Supple. Normal thyroid.  LUNGS:  Clear to auscultation bilaterally.  HEART: Regular rate and rhythm. BREASTS: Symmetric in size. No palpable masses or lymphadenopathy, skin changes, or nipple drainage. ABDOMEN: Soft, nontender, nondistended. No organomegaly. PELVIC: Normal external female genitalia. Vagina is pink and rugated.  Normal discharge. Nuvaring in place. Normal appearing cervix. Uterus is normal in size. No adnexal mass or tenderness. Chaperone present during the pelvic exam EXTREMITIES: No cyanosis, clubbing, or edema, 2+ distal pulses.     Assessment:    Healthy female exam.      Plan:  Patient with normal pap smear last year Screening mammogram ordered Health maintenance labs done with PCP Refill on Nuvaring provided    See After Visit Summary for Counseling  Recommendations

## 2024-04-17 NOTE — Progress Notes (Signed)
 Pt has some questions about perimenopause.

## 2024-07-30 ENCOUNTER — Ambulatory Visit
Admission: RE | Admit: 2024-07-30 | Discharge: 2024-07-30 | Disposition: A | Discharge status: Home / Self Care | Source: Ambulatory Visit | Attending: Obstetrics and Gynecology | Admitting: Obstetrics and Gynecology

## 2024-07-30 DIAGNOSIS — Z01419 Encounter for gynecological examination (general) (routine) without abnormal findings: Secondary | ICD-10-CM

## 2025-03-26 ENCOUNTER — Encounter: Admitting: Family Medicine
